# Patient Record
Sex: Male | Born: 1955 | Race: White | Hispanic: No | Marital: Married | Smoking: Never smoker
Health system: Southern US, Community
[De-identification: ages and names within clinical notes are randomized; demographics above are authoritative.]

## PROBLEM LIST (undated history)

## (undated) DIAGNOSIS — Z9889 Other specified postprocedural states: Secondary | ICD-10-CM

## (undated) DIAGNOSIS — G7000 Myasthenia gravis without (acute) exacerbation: Secondary | ICD-10-CM

## (undated) DIAGNOSIS — T4145XA Adverse effect of unspecified anesthetic, initial encounter: Secondary | ICD-10-CM

## (undated) DIAGNOSIS — S060XAA Concussion with loss of consciousness status unknown, initial encounter: Secondary | ICD-10-CM

## (undated) DIAGNOSIS — Z789 Other specified health status: Secondary | ICD-10-CM

## (undated) DIAGNOSIS — R112 Nausea with vomiting, unspecified: Secondary | ICD-10-CM

## (undated) DIAGNOSIS — T8859XA Other complications of anesthesia, initial encounter: Secondary | ICD-10-CM

## (undated) DIAGNOSIS — S060X9A Concussion with loss of consciousness of unspecified duration, initial encounter: Secondary | ICD-10-CM

## (undated) HISTORY — PX: OTHER SURGICAL HISTORY: SHX169

---

## 2008-02-19 ENCOUNTER — Ambulatory Visit: Payer: Self-pay | Admitting: Internal Medicine

## 2008-03-11 ENCOUNTER — Encounter: Payer: Self-pay | Admitting: Internal Medicine

## 2008-03-11 ENCOUNTER — Ambulatory Visit: Payer: Self-pay

## 2008-03-25 ENCOUNTER — Ambulatory Visit: Payer: Self-pay | Admitting: Internal Medicine

## 2008-03-25 LAB — CONVERTED CEMR LAB: Free T4: 0.89 ng/dL (ref 0.89–1.80)

## 2010-11-08 NOTE — Assessment & Plan Note (Signed)
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  He has a history of chest pain and  palpitations with 2 previous normal Myoviews.  He has never had a heart  catheterization.   I saw him last month for the first time for chest pain and palpitations,  this chest pain was fairly atypical.  He is a fairly active guy.   We got an echocardiogram which showed an ejection fraction of 50-55%  with no regional wall motion abnormalities.  We did put an event monitor  on him for 2 weeks, but he did not have any episodes of palpitations  during this time.  He said they resolved after he switched to  decaffeinated coffee.   He says he is doing great, he denies any recurrent chest pain or  exertional dyspnea.  He did get some blood work drawn which showed a  total cholesterol of 239, HDL of 59, LDL of 160, triglycerides of 99.  His C-reactive protein was good at 0.5.  His TSH was mildly elevated at  5.4.  Electrolytes and renal function were normal.   CURRENT MEDICATIONS:  None.   PHYSICAL EXAMINATION:  GENERAL:  He is well appearing and in no acute  distress.  Ambulatory in the clinic without any respiratory difficulty.  VITAL SIGNS:  Blood pressure is 100/70, heart rate is 76, weight is 155.  HEENT:  Normal.  NECK:  Supple.  No JVD.  Carotids are 2+ bilaterally without bruits.  There is no lymphadenopathy or thyromegaly.  CARDIAC:  PMI is normal.  Regular rate and rhythm.  No murmurs, rubs, or  gallops.  LUNGS:  Clear.  ABDOMEN:  Soft, nontender, and nondistended.  No hepatosplenomegaly.  No  bruits.  No masses.  Good bowel sounds.  EXTREMITIES:  Warm with no cyanosis, clubbing, or edema.  Good  pulses.  No rash.  NEURO:  Alert and oriented x3.  Cranial nerves II through XII are  intact.  Moves all 4 extremities without difficulty.  Affect is  pleasant.   ASSESSMENT:  1. Chest pain.  This is somewhat atypical.  I do think it is worth      screening treadmill test to further evaluate.  We will set this up.  2. Hyperlipidemia.  Cholesterol was markedly elevated.  He is      resistant to the notion of statins.  I told him it was unlikely to      be able to control it with diet alone, but he would like at least 3      months to see what he can do.  I referred him to Tanner James books      to see if that will help him.  We will recheck in 3 months.  3. Palpitations.  These have resolved.  We will await for results of      his monitor.  4. Possible hypothyroidism with an elevated thyroid-stimulating      hormone.  We will check a full thyroid panel, he may need a      referral.     Tanner James. Bensimhon, MD  Electronically Signed    DRB/MedQ  DD: 03/25/2008  DT: 03/26/2008  Job #: [id number]

## 2010-11-08 NOTE — Assessment & Plan Note (Signed)
[facility] OFFICE NOTE   XAVIOR, NIAZI                           MRN:          [id number]  DATE:02/19/2008                            DOB:          10/24/1955    REASON FOR EVALUATION:  Chest tightness and palpitations.   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  He denies any known history of coronary artery disease.  He  has not had hypertension, hyperlipidemia, or diabetes.  He does have a  history of chest pain and palpitations, and over the past 5 or 10 years  has had 2 stress tests, one with Dr. Gwen Pounds and the other one with Dr.  Dossie Arbour.  These were nuclear tests, both were normal.  He has never had  a catheterization.  He tells me that he does get occasional chest pain  and feels like his chest just gets tight, it happens every couple of  weeks.  It lasts about 30 minutes to an hour, it can happen at any time.  There are no other related symptoms.  He does walk a lot at work and  also in his spare time.  He denies any exertional discomfort or  shortness of breath.  He also notes that about twice a month, he gets  episodes where his heart rate just seems to speed up.  He is usually  sitting quietly and also he notes that his heart rate is speeding up and  then it slows back down about a minute or two later.  He does not get  chest pain with this.  He does not get short of breath.  There are no  syncope or presyncope.  These have been going on for quite some time.  He does not have any flushing or headaches associated with the symptoms.   REVIEW OF SYSTEMS:  Notable for peptic ulcer disease.  Remainder review  of systems is negative except for HPI and problem list.   PROBLEM LIST:  History of chest pain and palpitations.  He is status  post 2 previous negative Myoviews.  He has never had an echocardiogram.  He has peptic ulcer disease.   MEDICATIONS:   Aspirin one a day.   ALLERGIES:  None.   SOCIAL HISTORY:  He is married with two children.  He works as an Occupational hygienist at [employer].  He never smoked.  Drinks occasional alcoholic  beverage.   FAMILY HISTORY:  Mother is alive at 29.  She has history of coronary  artery disease and has a couple of stents in her 46s, also has a  pacemaker.  Father is alive at 75.  There is no family history of  premature coronary artery disease.   PHYSICAL EXAMINATION:  GENERAL:  He is well-appearing in no acute  distress.  Ambulates around the clinic without any respiratory  difficulty.  VITAL SIGNS:  Blood pressure is 108/78, heart rate is 80, weight is 154.  HEENT:  Normal.  NECK:  Supple.  No JVD.  Carotids are 2+ bilaterally  without bruits.  There is no lymphadenopathy or thyromegaly.  CARDIAC:  PMI is nondisplaced.  Regular rate and rhythm.  No murmurs,  rubs, or gallops.  LUNGS:  Clear.  ABDOMEN:  Soft, nontender, nondistended.  There is no  hepatosplenomegaly.  No bruits.  No masses.  Good bowel sounds.  EXTREMITIES:  Warm.  No cyanosis, clubbing, or edema.  Good pulses.  He  has several tattoos.  NEURO:  Alert and oriented x3.  Cranial nerves II through XII are  intact.  Moves all 4 extremities without difficulty.  Affect is  pleasant.   EKG shows normal sinus rhythm.  No significant ST-T wave abnormalities.  Normal axes  and intervals.  There is no pre-excitation.  QT interval is  normal.   ASSESSMENT/PLAN:  Chest pain and palpitations.  I think these are likely  benign.  They maybe related just to anxiety.  He does seem to be fairly  hypervigilant about his symptoms.  At this point, I think it is  reasonable to workup his palpitations as these have not been worked up  before to make sure he does not have an underlying supraventricular  tachycardia.  Check a 2-D echocardiogram to make sure he has  structurally normal heart.  Check a thyroid panel and also put a  LifeWatch monitor on  him.   Regarding his chest pain, I explained to him that I thought this was  noncardiac.  We will workup his palpitations first.  We did discuss  possibly repeating his stress test versus to simple reassurance and we  even discussed cardiac catheterization.  He like to think on this some  more.   DISPOSITION:  We will see him back in 1 month to review the results of  his testing and see how he is doing.     Bevelyn Buckles. Bensimhon, MD  Electronically Signed    DRB/MedQ  DD: 02/19/2008  DT: 02/20/2008  Job #: [id number]

## 2012-11-06 ENCOUNTER — Ambulatory Visit: Payer: Self-pay | Admitting: Ophthalmology

## 2014-01-07 ENCOUNTER — Ambulatory Visit: Payer: Self-pay | Admitting: Urology

## 2015-11-04 ENCOUNTER — Emergency Department
Admission: EM | Admit: 2015-11-04 | Discharge: 2015-11-04 | Disposition: A | Discharge status: Home / Self Care | Payer: Managed Care, Other (non HMO) | Attending: Emergency Medicine | Admitting: Emergency Medicine

## 2015-11-04 ENCOUNTER — Emergency Department: Payer: Managed Care, Other (non HMO)

## 2015-11-04 ENCOUNTER — Encounter: Payer: Self-pay | Admitting: Emergency Medicine

## 2015-11-04 DIAGNOSIS — M545 Low back pain, unspecified: Secondary | ICD-10-CM

## 2015-11-04 DIAGNOSIS — M5126 Other intervertebral disc displacement, lumbar region: Secondary | ICD-10-CM

## 2015-11-04 MED ORDER — DIAZEPAM 5 MG PO TABS: 5.0000 mg | ORAL_TABLET | Freq: Three times a day (TID) | ORAL | Status: DC | PRN

## 2015-11-04 MED ORDER — DIAZEPAM 5 MG/ML IJ SOLN
5.0000 mg | Freq: Once | INTRAMUSCULAR | Status: AC
  Administered 2015-11-04: 5 mg via INTRAVENOUS
  Filled 2015-11-04: qty 2

## 2015-11-04 MED ORDER — DIAZEPAM 5 MG/ML IJ SOLN
5.0000 mg | Freq: Once | INTRAMUSCULAR | Status: AC
  Administered 2015-11-04: 5 mg via INTRAVENOUS

## 2015-11-04 MED ORDER — ONDANSETRON HCL 4 MG/2ML IJ SOLN
INTRAMUSCULAR | Status: AC
  Administered 2015-11-04: 4 mg via INTRAVENOUS
  Filled 2015-11-04: qty 2

## 2015-11-04 MED ORDER — MORPHINE SULFATE (PF) 4 MG/ML IV SOLN: 4.0000 mg | Freq: Once | INTRAVENOUS | Status: DC

## 2015-11-04 MED ORDER — DIAZEPAM 5 MG/ML IJ SOLN
INTRAMUSCULAR | Status: AC
  Administered 2015-11-04: 5 mg via INTRAVENOUS
  Filled 2015-11-04: qty 2

## 2015-11-04 MED ORDER — METHYLPREDNISOLONE SODIUM SUCC 125 MG IJ SOLR
125.0000 mg | Freq: Once | INTRAMUSCULAR | Status: AC
  Administered 2015-11-04: 125 mg via INTRAVENOUS
  Filled 2015-11-04: qty 2

## 2015-11-04 MED ORDER — PREDNISONE 20 MG PO TABS: 60.0000 mg | ORAL_TABLET | Freq: Every day | ORAL | Status: AC

## 2015-11-04 MED ORDER — MORPHINE SULFATE (PF) 4 MG/ML IV SOLN
INTRAVENOUS | Status: AC
  Filled 2015-11-04: qty 1

## 2015-11-04 MED ORDER — ONDANSETRON HCL 4 MG/2ML IJ SOLN
4.0000 mg | Freq: Once | INTRAMUSCULAR | Status: AC
  Administered 2015-11-04: 4 mg via INTRAVENOUS

## 2015-11-04 NOTE — Discharge Instructions (Signed)
Herniated Disk  A herniated disk occurs when a disk in your spine bulges out too far. This condition is also called a ruptured disk or slipped disk. Your spine (backbone) is made up of bones called vertebrae. Between each pair of vertebrae is an oval disk with a soft, spongy center that acts as a shock absorber when you move. The spongy center is surrounded by a tough outer ring.  When you have a herniated disk, the spongy center of the disk bulges out or ruptures through the outer ring. A herniated disk can press on a nerve between your vertebrae and cause pain. A herniated disk can occur anywhere in your back or neck area, but the lower back is the most common spot.  CAUSES   In many cases, a herniated disk occurs just from getting older. As you age, the spongy insides of your disks tend to shrink and dry out. A herniated disk can result from gradual wear and tear. Injury or sudden strain can also cause a herniated disk.   RISK FACTORS  Aging is the main risk factor for a herniated disk. Other risk factors include:   Being a man between the ages of 30 and 50 years.   Having a job that requires heavy lifting, bending, or twisting.   Having a job that requires long hours of driving.   Not getting enough exercise.   Being overweight.   Smoking.  SIGNS AND SYMPTOMS   Signs and symptoms depend on which disk is herniated.   For a herniated disk in the lower back, you may have sharp pain in:    One part of your leg, hip, or buttocks.    The back of your calf.    The top or sole of your foot (sciatica).    For a herniated disk in the neck, you may feel pain:    When you move your neck.    Near or over your shoulder blade.    That moves to your upper arm, forearm, or fingers.    You may also have muscle weakness. It may be hard to:    Lift your leg or arm.    Stand on your toes.    Squeeze tightly with one of your hands.   Other symptoms can include:    Numbness or tingling in the affected areas of your body.     Loss of bladder or bowel control. This is a rare but serious sign of a severe herniated disk in the lower back.  DIAGNOSIS   Your health care provider will do a physical exam. During this exam, you may have to move certain body parts or assume various positions. For example, your health care provider may do the straight-leg test. This is a good way to test for a herniated disk in your lower back. In this test, the health care provider lifts your leg while you lie on your back. This is to see if you feel pain down your leg. Your health care provider will also check for numbness or loss of feeling.   Your health care provider will also check your:   Reflexes.   Muscle strength.   Posture.   Other tests may be done to help in making a diagnosis. These may include:   An X-ray of the spine to rule out other causes of back pain.    Other imaging studies, such as an MRI or CT scan. This is to check whether the herniated disk is   pressing on your spinal canal.   Electromyography (EMG). This test checks the nerves that control muscles. It is sometimes used to identify the specific area of nerve involvement.   TREATMENT   In many cases, herniated disk symptoms go away over a period of days or weeks. You will most likely be free of symptoms in 3-4 months. Treatment may include the following:   The initial treatment for a herniated disk is ashort period of rest.    Bed rest is often limited to 1 or 2 days. Resting for too long delays recovery.    If you have a herniated disk in your lower back, you should avoid sitting as much as possible because sitting increases pressure on the disk.   Medicines. These may include:     Nonsteroidal anti-inflammatory drugs (NSAIDs).    Muscle relaxants for back spasms.    Narcotic pain medicine if your pain is very bad.    Steroid injections. You may need these along the involved nerve root to help control pain. The steroid is injected in the area of the herniated disk. It  helps by reducing swelling around the disk.   Physical therapy. This may include exercises to strengthen the muscles that help support your spine.    You may need surgery if other treatments do not work.   HOME CARE INSTRUCTIONS  Follow all your health care provider's instructions. These may include:   Take all medicines as directed by your health care provider.   Rest for 2 days and then start moving.   Do not sit or stand for long periods of time.   Maintain good posture when sitting and standing.   Avoid movements that cause pain, such as bending or lifting.   When you are able to start lifting things again:   Bend with your knees.   Keep your back straight.   Hold heavy objects close to your body.   If you are overweight, ask your health care provider to help you start a weight-loss program.   When you are able to start exercising, ask your health care provider how much and what type of exercise is best for you.   Work with a physical therapist on stretching and strengthening exercises for your back.   Do not wear high-heeled shoes.   Do not sleep on your belly.   Do not smoke.   Keep all follow-up visits as directed by your health care provider.  SEEK MEDICAL CARE IF:   You have back or neck pain that is not getting better after 4 weeks.   You have very bad pain in your back or neck.   You develop numbness, tingling, or weakness along with pain.  SEEK IMMEDIATE MEDICAL CARE IF:    You have numbness, tingling, or weakness that makes you unable to use your arms or legs.   You lose control of your bladder or bowels.   You have dizziness or fainting.   You have shortness of breath.   MAKE SURE YOU:    Understand these instructions.   Will watch your condition.   Will get help right away if you are not doing well or get worse.     This information is not intended to replace advice given to you by your health care provider. Make sure you discuss any questions you have with your health  care provider.     Document Released: 06/09/2000 Document Revised: 07/03/2014 Document Reviewed: 05/16/2013  Elsevier Interactive Patient Education   2016 Elsevier Inc[employer]

## 2015-11-04 NOTE — ED Provider Notes (Signed)
[facility] Emergency Department Provider Note  ____________________________________________  Time seen: 3:15 AM  I have reviewed the triage vital signs and the nursing notes.   HISTORY  Chief Complaint Back Pain     HPI Tanner ReekBrian James is a 60 y.o. male with history of previous "back problems which she was evaluated [place] many years ago presents to the emergency department with complaint of low back pain that has been intermittent over the past week but with acute worsening tonight. Patient states as result of his back pain over the past week he slept in his lazy boy tonight. Patient states he awoke and attempted to get into his bed but was unable to do so secondary to a acute worsening of his pain. Patient states that his pain is currently 4 out of 10 but that he gets intense spasms in his lower back with the pain worsening to 10 out of 10. Patient states that he has at least 1-2 episodes similar to this every year but has not been evaluated by physician since when he did so many years ago in [place].     Past medical history "Back problems". There are no active problems to display for this patient.   Past surgical history None No current outpatient prescriptions on file.  Allergies No known drug allergies History reviewed. No pertinent family history.  Social History Social History  Substance Use Topics  . Smoking status: Never Smoker   . Smokeless tobacco: None  . Alcohol Use: No    Review of Systems  Constitutional: Negative for fever. Eyes: Negative for visual changes. ENT: Negative for sore throat. Cardiovascular: Negative for chest pain. Respiratory: Negative for shortness of breath. Gastrointestinal: Negative for abdominal pain, vomiting and diarrhea. Genitourinary: Negative for dysuria. Musculoskeletal: Positive for back pain. Skin: Negative for rash. Neurological: Negative for headaches, focal weakness or numbness.     10-point ROS otherwise negative.  ____________________________________________   PHYSICAL EXAM:  VITAL SIGNS: ED Triage Vitals  Enc Vitals Group     BP 11/04/15 0247 152/90 mmHg     Pulse Rate 11/04/15 0247 94     Resp 11/04/15 0247 20     Temp 11/04/15 0247 98.6 F (37 C)     Temp Source 11/04/15 0247 Oral     SpO2 11/04/15 0247 100 %     Weight 11/04/15 0247 150 lb (68.04 kg)     Height 11/04/15 0247 5\' 8"  (1.727 m)     Head Cir --      Peak Flow --      Pain Score 11/04/15 0248 4     Pain Loc --      Pain Edu? --      Excl. in GC? --      Constitutional: Alert and oriented.Apparent discomfort Eyes: Conjunctivae are normal. PERRL. Normal extraocular movements. ENT   Head: Normocephalic and atraumatic.   Nose: No congestion/rhinnorhea.   Mouth/Throat: Mucous membranes are moist.   Neck: No stridor. Hematological/Lymphatic/Immunilogical: No cervical lymphadenopathy. Cardiovascular: Normal rate, regular rhythm. Normal and symmetric distal pulses are present in all extremities. No murmurs, rubs, or gallops. Respiratory: Normal respiratory effort without tachypnea nor retractions. Breath sounds are clear and equal bilaterally. No wheezes/rales/rhonchi. Gastrointestinal: Soft and nontender. No distention. There is no CVA tenderness. Genitourinary: deferred Musculoskeletal: Nontender with normal range of motion in all extremities. No joint effusions.  No lower extremity tenderness nor edema. Bilateral paraspinal muscle pain with palpation Neurologic:  Normal speech and  language. No gross focal neurologic deficits are appreciated. Speech is normal.  Skin:  Skin is warm, dry and intact. No rash noted. Psychiatric: Mood and affect are normal. Speech and behavior are normal. Patient exhibits appropriate insight and judgment.    RADIOLOGY     MR Lumbar Spine Wo Contrast (Final result) Result time: 11/04/15 05:34:46   Final result by Rad Results In Interface  (11/04/15 05:34:46)   Narrative:   CLINICAL DATA: Severe low back pain for 1 week, worsening.  EXAM: MRI LUMBAR SPINE WITHOUT CONTRAST  TECHNIQUE: Multiplanar, multisequence MR imaging of the lumbar spine was performed. No intravenous contrast was administered.  COMPARISON: CT abdomen and pelvis January 07, 2014  FINDINGS: The lumbar vertebral bodies and posterior elements are intact and aligned with maintenance of lumbar lordosis. Using the reference level of the last well-formed intervertebral disc as L5-S1, moderate L3-4 and L4-5 disc height loss with decreased T2 signal within the disc compatible with desiccation. Mild chronic discogenic endplate changes L3-4 and L4-5 without STIR signal abnormality to suggest acute osseous process.  Conus medullaris terminates at L1 and appears normal morphology and signal characteristics. Cauda equina is normal. Included prevertebral and paraspinal soft tissues are normal.  Level by level evaluation:  T12-L1, L1-2, L2-3: No disc bulge, canal stenosis nor neural foraminal narrowing.  L3-4: Small broad-based disc bulge with superimposed 2 mm LEFT central disc protrusion with small amount of presumed subligamentous hemorrhage posteriorly displaces traversing LEFT L4 nerve within the lateral recess. No canal stenosis. Mild LEFT neural foraminal narrowing.  L4-5: Moderate 4 mm broad-based disc bulge. Mild facet arthropathy and ligamentum flavum redundancy without canal stenosis though there is encroachment upon the traversing L5 nurse in the lateral recesses. Mild bilateral neural foraminal narrowing.  L5-S1: 2 mm central disc protrusion. Mild facet arthropathy and ligamentum flavum redundancy without canal stenosis or neural foraminal narrowing.  IMPRESSION: Small LEFT central L3-4 disc protrusion is likely acute, and displaces the traversing LEFT L4 nerve.  No acute fracture or malalignment.  No canal stenosis. Mild L3-4 and  L4-5 neural foraminal narrowing.   Electronically Signed By: Awilda Metro M.D. On: 11/04/2015 05:34         INITIAL IMPRESSION / ASSESSMENT AND PLAN / ED COURSE  Pertinent labs & imaging results that were available during my care of the patient were reviewed by me and considered in my medical decision making (see chart for details).  Patient received 5 mg IV Valium in the emergency department with improvement of back spasms. MRI revealed L3-4 herniated disc. Patient will be prescribed Valium for home and will be referred to Dr. Margaretann Loveless for further outpatient evaluation  ____________________________________________   FINAL CLINICAL IMPRESSION(S) / ED DIAGNOSES  Final diagnoses:  Lumbar back pain  Lumbar herniated disc      Darci Current, MD 11/04/15 [phone]

## 2015-11-04 NOTE — ED Notes (Signed)
Pt arrived via EMS from home c/o lower back pain. Reports that back pain started about a week ago, denies any known injury, came into tonight because back pain got worse, with spasms, unable to stand up. Denies any urinary symptoms. Alert and oriented, wife at bedside with patient.

## 2016-08-14 ENCOUNTER — Ambulatory Visit
Admission: EM | Admit: 2016-08-14 | Discharge: 2016-08-14 | Disposition: A | Discharge status: Home / Self Care | Payer: Managed Care, Other (non HMO) | Attending: Family Medicine | Admitting: Family Medicine

## 2016-08-14 ENCOUNTER — Ambulatory Visit (INDEPENDENT_AMBULATORY_CARE_PROVIDER_SITE_OTHER): Payer: Managed Care, Other (non HMO)

## 2016-08-14 DIAGNOSIS — M722 Plantar fascial fibromatosis: Secondary | ICD-10-CM

## 2016-08-14 MED ORDER — NAPROXEN 500 MG PO TABS: 500.0000 mg | ORAL_TABLET | Freq: Two times a day (BID) | ORAL | 0 refills | Status: DC

## 2016-08-14 NOTE — ED Provider Notes (Signed)
CSN: [id number]     Arrival date & time 08/14/16  [id number] History   First MD Initiated Contact with Patient 08/14/16 1012     Chief Complaint  Patient presents with  . Foot Pain    Left Foot   (Consider location/radiation/quality/duration/timing/severity/associated sxs/prior Treatment) HPI  This a [age] year old male who states that 2 days ago he started having left foot pain when he indicates the lateral and plantar area.  Does not Remember any trauma to the area not had any increase in activity other than walking longer distance to lunch on 2 days prior to the onset.Now Painful to ambulate.      History reviewed. No pertinent past medical history. History reviewed. No pertinent surgical history. History reviewed. No pertinent family history. Social History  Substance Use Topics  . Smoking status: Never Smoker  . Smokeless tobacco: Never Used  . Alcohol use Yes    Review of Systems  Allergies  Patient has no known allergies.  Home Medications   Prior to Admission medications   Medication Sig Start Date End Date Taking? Authorizing Provider  naproxen (NAPROSYN) 500 MG tablet Take 1 tablet (500 mg total) by mouth 2 (two) times daily with a meal. 08/14/16   Lutricia FeilWilliam P Roemer, PA-C   Meds Ordered and Administered this Visit  Medications - No data to display  BP 123/72 (BP Location: Left Arm)   Pulse 74   Temp 98.1 F (36.7 C) (Oral)   Resp 18   Ht 5\' 8"  (1.727 m)   Wt 150 lb (68 kg)   SpO2 100%   BMI 22.81 kg/m  No data found.   Physical Exam  Urgent Care Course     Procedures (including critical care time)  Labs Review Labs Reviewed - No data to display  Imaging Review Dg Foot Complete Left  Result Date: 08/14/2016 CLINICAL DATA:  Severe pain. EXAM: LEFT FOOT - COMPLETE 3+ VIEW COMPARISON:  No recent prior P FINDINGS: No acute bony or joint abnormality identified. No evidence of fracture or dislocation. IMPRESSION: No acute abnormality. Electronically Signed    By: Maisie Fushomas  Register   On: 08/14/2016 09:51     Visual Acuity Review  Right Eye Distance:   Left Eye Distance:   Bilateral Distance:    Right Eye Near:   Left Eye Near:    Bilateral Near:         MDM   1. Plantar fasciitis of left foot    Discharge Medication List as of 08/14/2016 10:29 AM    START taking these medications   Details  naproxen (NAPROSYN) 500 MG tablet Take 1 tablet (500 mg total) by mouth 2 (two) times daily with a meal., Starting Mon 08/14/2016, Normal      Plan: 1. Test/x-ray results and diagnosis reviewed with patient 2. rx as per orders; risks, benefits, potential side effects reviewed with patient 3. Recommend supportive treatment with Limiting walking to avoid symptoms as much as possible. Use Silastic heel inserts in shoes. Take Naprosyn twice daily with food. He was given instructions with exercises for plantar fasciitis. If he is not improving in a week or 2 he should follow-up with podiatrist. Address and phone number and phone number given to the patient. 4. F/u prn if symptoms worsen or don't improve     Lutricia FeilWilliam P Roemer, PA-C 08/14/16 1034

## 2016-08-14 NOTE — ED Triage Notes (Signed)
Pt states that about 2 days ago he started to have left foot pain. He doesnt remember any trauma to it other than 30 years ago he shut it in a car door.

## 2016-12-20 ENCOUNTER — Encounter: Payer: Self-pay | Admitting: *Deleted

## 2016-12-28 ENCOUNTER — Ambulatory Visit
Admission: RE | Admit: 2016-12-28 | Discharge: 2016-12-28 | Disposition: A | Discharge status: Home / Self Care | Payer: Managed Care, Other (non HMO) | Source: Ambulatory Visit | Attending: Ophthalmology | Admitting: Ophthalmology

## 2016-12-28 ENCOUNTER — Encounter
Admission: RE | Disposition: A | Discharge status: Home / Self Care | Payer: Self-pay | Source: Ambulatory Visit | Attending: Ophthalmology

## 2016-12-28 ENCOUNTER — Ambulatory Visit: Payer: Managed Care, Other (non HMO) | Admitting: Anesthesiology

## 2016-12-28 ENCOUNTER — Encounter: Payer: Self-pay | Admitting: Anesthesiology

## 2016-12-28 DIAGNOSIS — H2511 Age-related nuclear cataract, right eye: Secondary | ICD-10-CM | POA: Insufficient documentation

## 2016-12-28 HISTORY — DX: Concussion with loss of consciousness of unspecified duration, initial encounter: S06.0X9A

## 2016-12-28 HISTORY — PX: CATARACT EXTRACTION W/PHACO: SHX586

## 2016-12-28 HISTORY — DX: Nausea with vomiting, unspecified: R11.2

## 2016-12-28 HISTORY — DX: Other complications of anesthesia, initial encounter: T88.59XA

## 2016-12-28 HISTORY — DX: Other specified postprocedural states: Z98.890

## 2016-12-28 HISTORY — DX: Concussion with loss of consciousness status unknown, initial encounter: S06.0XAA

## 2016-12-28 HISTORY — DX: Adverse effect of unspecified anesthetic, initial encounter: T41.45XA

## 2016-12-28 HISTORY — DX: Other specified health status: Z78.9

## 2016-12-28 SURGERY — PHACOEMULSIFICATION, CATARACT, WITH IOL INSERTION
Anesthesia: Monitor Anesthesia Care | Site: Eye | Laterality: Right | Wound class: Clean

## 2016-12-28 MED ORDER — NEOMYCIN-POLYMYXIN-DEXAMETH 0.1 % OP OINT
TOPICAL_OINTMENT | OPHTHALMIC | Status: DC | PRN
  Administered 2016-12-28: 1 via OPHTHALMIC

## 2016-12-28 MED ORDER — ARMC OPHTHALMIC DILATING DROPS
1.0000 "application " | OPHTHALMIC | Status: DC
  Administered 2016-12-28 (×3): 1 via OPHTHALMIC

## 2016-12-28 MED ORDER — NEOMYCIN-POLYMYXIN-DEXAMETH 3.5-10000-0.1 OP OINT
TOPICAL_OINTMENT | OPHTHALMIC | Status: AC
  Filled 2016-12-28: qty 3.5

## 2016-12-28 MED ORDER — MIDAZOLAM HCL 2 MG/2ML IJ SOLN
INTRAMUSCULAR | Status: AC
  Filled 2016-12-28: qty 2

## 2016-12-28 MED ORDER — NA HYALUR & NA CHOND-NA HYALUR 0.55-0.5 ML IO KIT
PACK | INTRAOCULAR | Status: AC
  Filled 2016-12-28: qty 1.05

## 2016-12-28 MED ORDER — EPINEPHRINE PF 1 MG/ML IJ SOLN
INTRAOCULAR | Status: DC | PRN
  Administered 2016-12-28: 09:00:00 via OPHTHALMIC

## 2016-12-28 MED ORDER — EPINEPHRINE PF 1 MG/ML IJ SOLN
INTRAMUSCULAR | Status: AC
  Filled 2016-12-28: qty 2

## 2016-12-28 MED ORDER — POVIDONE-IODINE 5 % OP SOLN
OPHTHALMIC | Status: AC
  Filled 2016-12-28: qty 30

## 2016-12-28 MED ORDER — LIDOCAINE HCL (PF) 4 % IJ SOLN
INTRAMUSCULAR | Status: AC
  Filled 2016-12-28: qty 5

## 2016-12-28 MED ORDER — MOXIFLOXACIN HCL 0.5 % OP SOLN
1.0000 [drp] | OPHTHALMIC | Status: DC
  Administered 2016-12-28 (×3): 1 [drp] via OPHTHALMIC

## 2016-12-28 MED ORDER — MOXIFLOXACIN HCL 0.5 % OP SOLN
OPHTHALMIC | Status: AC
  Filled 2016-12-28: qty 3

## 2016-12-28 MED ORDER — NA HYALUR & NA CHOND-NA HYALUR 0.4-0.35 ML IO KIT
PACK | INTRAOCULAR | Status: DC | PRN
  Administered 2016-12-28: .35 mL via INTRAOCULAR

## 2016-12-28 MED ORDER — MIDAZOLAM HCL 2 MG/2ML IJ SOLN
INTRAMUSCULAR | Status: DC | PRN
  Administered 2016-12-28 (×2): 1 mg via INTRAVENOUS

## 2016-12-28 MED ORDER — POVIDONE-IODINE 5 % OP SOLN
OPHTHALMIC | Status: DC | PRN
  Administered 2016-12-28: 1 via OPHTHALMIC

## 2016-12-28 MED ORDER — ARMC OPHTHALMIC DILATING DROPS
OPHTHALMIC | Status: AC
  Filled 2016-12-28: qty 0.4

## 2016-12-28 MED ORDER — SODIUM CHLORIDE 0.9 % IV SOLN
INTRAVENOUS | Status: DC
  Administered 2016-12-28: 08:00:00 via INTRAVENOUS

## 2016-12-28 MED ORDER — LIDOCAINE HCL (PF) 4 % IJ SOLN
INTRAMUSCULAR | Status: DC | PRN
  Administered 2016-12-28: 4 mL via OPHTHALMIC

## 2016-12-28 MED ORDER — CARBACHOL 0.01 % IO SOLN
INTRAOCULAR | Status: DC | PRN
  Administered 2016-12-28: 0.5 mL via INTRAOCULAR

## 2016-12-28 SURGICAL SUPPLY — 15 items
GLOVE BIO SURGEON STRL SZ8 (GLOVE) ×2 IMPLANT
GLOVE BIOGEL M 6.5 STRL (GLOVE) ×2 IMPLANT
GLOVE SURG LX 7.5 STRW (GLOVE) ×1
GLOVE SURG LX STRL 7.5 STRW (GLOVE) ×1 IMPLANT
GOWN STRL REUS W/ TWL LRG LVL3 (GOWN DISPOSABLE) ×2 IMPLANT
GOWN STRL REUS W/TWL LRG LVL3 (GOWN DISPOSABLE) ×2
LENS IOL TECNIS ITEC 21.0 (Intraocular Lens) ×2 IMPLANT
PACK CATARACT (MISCELLANEOUS) ×2 IMPLANT
PACK CATARACT BRASINGTON LX (MISCELLANEOUS) ×2 IMPLANT
PACK EYE AFTER SURG (MISCELLANEOUS) ×2 IMPLANT
SOL BSS BAG (MISCELLANEOUS) ×2
SOLUTION BSS BAG (MISCELLANEOUS) ×1 IMPLANT
SYR 5ML LL (SYRINGE) ×2 IMPLANT
WATER STERILE IRR 250ML POUR (IV SOLUTION) ×2 IMPLANT
WIPE NON LINTING 3.25X3.25 (MISCELLANEOUS) ×2 IMPLANT

## 2016-12-28 NOTE — Transfer of Care (Signed)
Immediate Anesthesia Transfer of Care Note  Patient: Tanner James  Procedure(s) Performed: Procedure(s) with comments: CATARACT EXTRACTION PHACO AND INTRAOCULAR LENS PLACEMENT (IOC) (Right) - [place] 00:29.6 AP% 17.5 CDE 5.19 Fluid Pack lot # [id number]  Patient Location: PACU  Anesthesia Type:MAC  Level of Consciousness: awake, alert , oriented and patient cooperative  Airway & Oxygen Therapy: Patient Spontanous Breathing  Post-op Assessment: Report given to RN, Post -op Vital signs reviewed and stable and Patient moving all extremities X 4  Post vital signs: Reviewed and stable  Last Vitals:  Vitals:   12/28/16 0755 12/28/16 0922  BP: 123/81 114/64  Pulse: 67 71  Resp: 18 16  Temp: 36.7 C 36.8 C    Last Pain:  Vitals:   12/28/16 0755  TempSrc: Oral         Complications: No apparent anesthesia complications

## 2016-12-28 NOTE — Op Note (Signed)
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 Item Serial No. Manufacturer Lot No. LRB No. Used  LENS IOL DIOP 21.0 - [id number] Intraocular Lens LENS IOL DIOP 21.0 [id number]   Right 1       ULTRASOUND TIME: 18 %  of 0 minutes 30 seconds, CDE 5.2  SURGEON:  Deirdre Evenerhadwick R. Acsa Estey, MD   ANESTHESIA:  Topical with tetracaine drops and 2% Xylocaine jelly, augmented with 1% preservative-free intracameral lidocaine.    COMPLICATIONS:  None.   DESCRIPTION OF PROCEDURE:  The patient was identified in the holding room and transported to the operating room and placed in the supine position under the operating microscope. Theright eye was identified as the operative eye and it was prepped and draped in the usual sterile ophthalmic fashion.   A 1 millimeter clear-corneal paracentesis was made at the 12:00 position.  0.5 ml of preservative-free 1% lidocaine was injected into the anterior chamber. The anterior chamber was filled with Viscoat viscoelastic.  A 2.4 millimeter keratome was used to make a near-clear corneal incision at the 9:00 position. A curvilinear capsulorrhexis was made with a cystotome and capsulorrhexis forceps.  Balanced salt solution was used to hydrodissect and hydrodelineate the nucleus.   Phacoemulsification was then used in stop and chop fashion to remove the lens nucleus and epinucleus.  The remaining cortex was then removed using the irrigation and aspiration handpiece. Provisc was then placed into the capsular bag to distend it for lens placement.  A lens was then injected into the capsular bag.  The remaining viscoelastic was aspirated.  Wounds were hydrated with balanced salt  solution.  The anterior chamber was inflated to a physiologic pressure with balanced salt solution. Vigamox 0.2 ml of a 1mg  per ml solution was injected into the anterior chamber for a dose of 0.2 mg of intracameral antibiotic at the completion of the case. Miostat was placed into the anterior chamber to constrict the pupil.  No wound leaks were noted.  Topical Vigamox drops and Maxitrol ointment were applied to the eye.  The patient was taken to the recovery room in stable condition without complications of anesthesia or surgery.  Misao Fackrell 12/28/2016, 9:19 AM

## 2016-12-28 NOTE — H&P (Signed)
The History and Physical notes are on paper, have been signed, and are to be scanned. The patient remains stable and unchanged from the H&P.   Previous H&P reviewed, patient examined, and there are no changes.  Tanner James 12/28/2016 8:03 AM

## 2016-12-28 NOTE — Anesthesia Post-op Follow-up Note (Cosign Needed)
Anesthesia QCDR form completed.        

## 2016-12-28 NOTE — Anesthesia Postprocedure Evaluation (Signed)
Anesthesia Post Note  Patient: Tanner James Surges  Procedure(s) Performed: Procedure(s) (LRB): CATARACT EXTRACTION PHACO AND INTRAOCULAR LENS PLACEMENT (IOC) (Right)  Patient location during evaluation: PACU Anesthesia Type: MAC Level of consciousness: awake and alert Pain management: pain level controlled Vital Signs Assessment: post-procedure vital signs reviewed and stable Respiratory status: spontaneous breathing, nonlabored ventilation and respiratory function stable Cardiovascular status: stable and blood pressure returned to baseline Anesthetic complications: no     Last Vitals:  Vitals:   12/28/16 0755 12/28/16 0922  BP: 123/81 114/64  Pulse: 67 71  Resp: 18 16  Temp: 36.7 C 36.8 C    Last Pain:  Vitals:   12/28/16 0755  TempSrc: Oral                 Silvana Newness A

## 2016-12-28 NOTE — Discharge Instructions (Signed)
Eye Surgery Discharge Instructions  Expect mild scratchy sensation or mild soreness. DO NOT RUB YOUR EYE!  The day of surgery:  Minimal physical activity, but bed rest is not required  No reading, computer work, or close hand work  No bending, lifting, or straining.  May watch TV  For 24 hours:  No driving, legal decisions, or alcoholic beverages  Safety precautions  Eat anything you prefer: It is better to start with liquids, then soup then solid foods.  _____ Eye patch should be worn until postoperative exam tomorrow.  ____ Solar shield eyeglasses should be worn for comfort in the sunlight/patch while sleeping  Resume all regular medications including aspirin or Coumadin if these were discontinued prior to surgery. You may shower, bathe, shave, or wash your hair. Tylenol may be taken for mild discomfort.  Call your doctor if you experience significant pain, nausea, or vomiting, fever > 101 or other signs of infection. [phone] or [phone] Specific instructions:  Follow-up Information    Lockie MolaBrasington, Chadwick, MD. Go on 12/29/2016.   Specialty:  Ophthalmology Why:  Appointment time is set for 10:35 am TOMORROW.  Contact information: [address]   [place] [id number] [phone]

## 2016-12-28 NOTE — Anesthesia Preprocedure Evaluation (Signed)
Anesthesia Evaluation  Patient identified by MRN, date of birth, ID band Patient awake    Reviewed: Allergy & Precautions, NPO status , Patient's Chart, lab work & pertinent test results, reviewed documented beta blocker date and time   History of Anesthesia Complications (+) PONV and history of anesthetic complications  Airway Mallampati: II  TM Distance: >3 FB     Dental  (+) Chipped   Pulmonary           Cardiovascular      Neuro/Psych    GI/Hepatic   Endo/Other    Renal/GU      Musculoskeletal   Abdominal   Peds  Hematology   Anesthesia Other Findings   Reproductive/Obstetrics                             Anesthesia Physical Anesthesia Plan  ASA: III  Anesthesia Plan: General   Post-op Pain Management:    Induction: Intravenous  PONV Risk Score and Plan:   Airway Management Planned: Oral ETT  Additional Equipment:   Intra-op Plan:   Post-operative Plan:   Informed Consent: I have reviewed the patients History and Physical, chart, labs and discussed the procedure including the risks, benefits and alternatives for the proposed anesthesia with the patient or authorized representative who has indicated his/her understanding and acceptance.     Plan Discussed with: CRNA  Anesthesia Plan Comments:         Anesthesia Quick Evaluation

## 2016-12-29 ENCOUNTER — Encounter: Payer: Self-pay | Admitting: Ophthalmology

## 2017-09-12 ENCOUNTER — Emergency Department: Payer: Managed Care, Other (non HMO)

## 2017-09-12 ENCOUNTER — Encounter: Payer: Self-pay | Admitting: Emergency Medicine

## 2017-09-12 ENCOUNTER — Emergency Department
Admission: EM | Admit: 2017-09-12 | Discharge: 2017-09-12 | Disposition: A | Discharge status: Home / Self Care | Payer: Managed Care, Other (non HMO) | Attending: Emergency Medicine | Admitting: Emergency Medicine

## 2017-09-12 DIAGNOSIS — R1310 Dysphagia, unspecified: Secondary | ICD-10-CM

## 2017-09-12 DIAGNOSIS — R479 Unspecified speech disturbances: Secondary | ICD-10-CM | POA: Diagnosis not present

## 2017-09-12 LAB — CBC WITH DIFFERENTIAL/PLATELET
BASOS ABS: 0.1 10*3/uL (ref 0–0.1)
Basophils Relative: 1 %
EOS ABS: 0.2 10*3/uL (ref 0–0.7)
EOS PCT: 2 %
HCT: 42.3 % (ref 40.0–52.0)
Hemoglobin: 14.2 g/dL (ref 13.0–18.0)
Lymphocytes Relative: 14 %
Lymphs Abs: 1.2 10*3/uL (ref 1.0–3.6)
MCH: 31.3 pg (ref 26.0–34.0)
MCHC: 33.5 g/dL (ref 32.0–36.0)
MCV: 93.6 fL (ref 80.0–100.0)
Monocytes Absolute: 0.9 10*3/uL (ref 0.2–1.0)
Monocytes Relative: 10 %
Neutro Abs: 6.2 10*3/uL (ref 1.4–6.5)
Neutrophils Relative %: 73 %
PLATELETS: 288 10*3/uL (ref 150–440)
RBC: 4.52 MIL/uL (ref 4.40–5.90)
RDW: 16.8 % — ABNORMAL HIGH (ref 11.5–14.5)
WBC: 8.5 10*3/uL (ref 3.8–10.6)

## 2017-09-12 LAB — SEDIMENTATION RATE: SED RATE: 4 mm/h (ref 0–20)

## 2017-09-12 LAB — COMPREHENSIVE METABOLIC PANEL
ALT: 15 U/L — ABNORMAL LOW (ref 17–63)
AST: 23 U/L (ref 15–41)
Albumin: 4.4 g/dL (ref 3.5–5.0)
Alkaline Phosphatase: 62 U/L (ref 38–126)
Anion gap: 8 (ref 5–15)
BILIRUBIN TOTAL: 0.9 mg/dL (ref 0.3–1.2)
BUN: 15 mg/dL (ref 6–20)
CHLORIDE: 106 mmol/L (ref 101–111)
CO2: 26 mmol/L (ref 22–32)
Calcium: 8.9 mg/dL (ref 8.9–10.3)
Creatinine, Ser: 1.08 mg/dL (ref 0.61–1.24)
Glucose, Bld: 86 mg/dL (ref 65–99)
POTASSIUM: 4.1 mmol/L (ref 3.5–5.1)
Sodium: 140 mmol/L (ref 135–145)
Total Protein: 7.5 g/dL (ref 6.5–8.1)

## 2017-09-12 MED ORDER — IOPAMIDOL (ISOVUE-300) INJECTION 61%
75.0000 mL | Freq: Once | INTRAVENOUS | Status: AC | PRN
  Administered 2017-09-12: 75 mL via INTRAVENOUS

## 2017-09-12 NOTE — ED Notes (Signed)
Patient transported to CT 

## 2017-09-12 NOTE — ED Notes (Signed)
Pt back from MRI at this time

## 2017-09-12 NOTE — ED Notes (Signed)
First Nurse Note:  [facility], alert and oriented.  Apologized for wait. Patient told that he is next for bed placement in the ED.  Denies any new or worsening symptoms.

## 2017-09-12 NOTE — ED Notes (Signed)
First Nurse Note:  Patient to CT via WC.

## 2017-09-12 NOTE — ED Notes (Signed)
First Nurse Note:  [facility] X weeks and slurring of speech X 2 days.  Alert and oriented, ambulatory.  NAD.

## 2017-09-12 NOTE — ED Notes (Signed)
First Nurse Note:  Patient to room 7 via WC.  Morrie SheldonAshley RN aware of placement in room.

## 2017-09-12 NOTE — ED Triage Notes (Signed)
Pt sent  [facility] for further eval of difficulty swallowing for a couple of weeks and slurred speech for two days.  Denies any headache or dizziness.

## 2017-09-12 NOTE — ED Notes (Signed)
Pt verbalizes understanding of d/c instructions and follow up. 

## 2017-09-12 NOTE — ED Provider Notes (Signed)
[facility] Emergency Department Provider Note       Time seen: ----------------------------------------- 10:04 AM on 09/12/2017 -----------------------------------------   I have reviewed the triage vital signs and the nursing notes.  HISTORY   Chief Complaint Dysphagia    HPI Tanner James is a 62 y.o. male with no significant past medical history who presents to the ED for difficulty swallowing for the past couple weeks.  Patient had slurred speech for 2 days.  Patient states he has difficulty saying both S and J.  He also has difficulty blowing his nose.  He has never had these problems before.  He states his food does not get stuck but he has trouble getting the food down.  He denies numbness, tingling, weakness or headache  Past Medical History:  Diagnosis Date  . Complication of anesthesia   . Concussion    8/17  . Medical history non-contributory   . PONV (postoperative nausea and vomiting)    AFTER COLONOSCOPY    There are no active problems to display for this patient.   Past Surgical History:  Procedure Laterality Date  . CATARACT EXTRACTION W/PHACO Right 12/28/2016   Procedure: CATARACT EXTRACTION PHACO AND INTRAOCULAR LENS PLACEMENT (IOC);  Surgeon: Lockie MolaBrasington, Chadwick, MD;  Location: [facility] ORS;  Service: Ophthalmology;  Laterality: Right;  US 00:29.6 AP% 17.5 CDE 5.19 Fluid Pack lot # [id number]  . COLONSCOPY      Allergies Patient has no known allergies.  Social History Social History   Tobacco Use  . Smoking status: Never Smoker  . Smokeless tobacco: Never Used  Substance Use Topics  . Alcohol use: Yes  . Drug use: No    Review of Systems Constitutional: Negative for fever. Eyes: Negative for vision changes ENT: Positive for dysphagia Cardiovascular: Negative for chest pain. Respiratory: Negative for shortness of breath. Gastrointestinal: Negative for abdominal pain, vomiting and diarrhea. Musculoskeletal: Negative for  back pain. Skin: Negative for rash. Neurological: Negative for headaches, focal weakness or numbness.  All systems negative/normal/unremarkable except as stated in the HPI  ____________________________________________   PHYSICAL EXAM:  VITAL SIGNS: ED Triage Vitals [09/12/17 0829]  Enc Vitals Group     BP (!) 137/111     Pulse Rate 81     Resp 20     Temp 97.9 F (36.6 C)     Temp Source Oral     SpO2 100 %     Weight 150 lb (68 kg)     Height      Head Circumference      Peak Flow      Pain Score      Pain Loc      Pain Edu?      Excl. in GC?    Constitutional: Alert and oriented. Well appearing and in no distress. Eyes: Conjunctivae are normal. Normal extraocular movements. ENT   Head: Normocephalic and atraumatic.   Nose: No congestion/rhinnorhea.   Mouth/Throat: Mucous membranes are moist.   Neck: No stridor. Cardiovascular: Normal rate, regular rhythm. No murmurs, rubs, or gallops. Respiratory: Normal respiratory effort without tachypnea nor retractions. Breath sounds are clear and equal bilaterally. No wheezes/rales/rhonchi. Gastrointestinal: Soft and nontender. Normal bowel sounds Musculoskeletal: Nontender with normal range of motion in extremities. No lower extremity tenderness nor edema. Neurologic:  Normal speech and language. No gross focal neurologic deficits are appreciated.  Skin:  Skin is warm, dry and intact. No rash noted. Psychiatric: Mood and affect are normal. Speech and behavior are normal.  ____________________________________________  ED COURSE:  As part of my medical decision making, I reviewed the following data within the electronic MEDICAL RECORD NUMBER History obtained from family if available, nursing notes, old chart and ekg, as well as notes from prior ED visits. Patient presented for dysphagia, we will assess with labs and imaging as indicated at this time.   Procedures ____________________________________________   LABS  (pertinent positives/negatives)  Labs Reviewed  CBC WITH DIFFERENTIAL/PLATELET - Abnormal; Notable for the following components:      Result Value   RDW 16.8 (*)    All other components within normal limits  COMPREHENSIVE METABOLIC PANEL - Abnormal; Notable for the following components:   ALT 15 (*)    All other components within normal limits  ACETYLCHOLINE RECEPTOR, BINDING  SEDIMENTATION RATE  C-REACTIVE PROTEIN    RADIOLOGY Images were viewed by me  CT head is unremarkable Brain MRI is negative ____________________________________________  DIFFERENTIAL DIAGNOSIS   CVA, neuromuscular disorder, neck mass, esophageal stenosis, GERD  FINAL ASSESSMENT AND PLAN  Dysphagia   Plan: The patient had presented for difficult to swallowing. Patient's labs were unremarkable. Patient's imaging regarding CT imaging and MRI was negative of the head.  I did discuss with neurology who recommends outpatient workup for possible myasthenia.  Laboratory studies have been sent.  He has been referred for close outpatient follow-up with neurology.   Ulice Dash, MD   Note: This note was generated in part or whole with voice recognition software. Voice recognition is usually quite accurate but there are transcription errors that can and very often do occur. I apologize for any typographical errors that were not detected and corrected.     Emily Filbert, MD 09/12/17 [phone]

## 2017-09-18 ENCOUNTER — Telehealth: Payer: Self-pay | Admitting: Emergency Medicine

## 2017-09-18 LAB — ACETYLCHOLINE RECEPTOR, BINDING: Acety choline binding ab: 11 nmol/L — ABNORMAL HIGH (ref 0.00–0.24)

## 2017-09-18 NOTE — Telephone Encounter (Signed)
Called patient to find out where he is going to follow up with neuro so we can assure results go to them.  I left him a message.

## 2018-03-19 ENCOUNTER — Other Ambulatory Visit: Payer: Self-pay

## 2018-03-19 ENCOUNTER — Ambulatory Visit (INDEPENDENT_AMBULATORY_CARE_PROVIDER_SITE_OTHER): Payer: Managed Care, Other (non HMO)

## 2018-03-19 ENCOUNTER — Ambulatory Visit (HOSPITAL_COMMUNITY)
Admission: EM | Admit: 2018-03-19 | Discharge: 2018-03-19 | Disposition: A | Discharge status: Home / Self Care | Payer: Managed Care, Other (non HMO) | Attending: Family Medicine | Admitting: Family Medicine

## 2018-03-19 DIAGNOSIS — S63259A Unspecified dislocation of unspecified finger, initial encounter: Secondary | ICD-10-CM | POA: Diagnosis not present

## 2018-03-19 DIAGNOSIS — S6991XA Unspecified injury of right wrist, hand and finger(s), initial encounter: Secondary | ICD-10-CM | POA: Diagnosis not present

## 2018-03-19 DIAGNOSIS — S62646A Nondisplaced fracture of proximal phalanx of right little finger, initial encounter for closed fracture: Secondary | ICD-10-CM | POA: Diagnosis not present

## 2018-03-19 MED ORDER — ACETAMINOPHEN 325 MG PO TABS
ORAL_TABLET | ORAL | Status: AC
  Filled 2018-03-19: qty 2

## 2018-03-19 MED ORDER — ACETAMINOPHEN 325 MG PO TABS: 650.0000 mg | ORAL_TABLET | Freq: Once | ORAL | Status: DC

## 2018-03-19 NOTE — Discharge Instructions (Addendum)
Please continue to wear your finger splint until your follow up with a hand specialist. You may use over the counter ibuprofen or acetaminophen as needed for pain.

## 2018-03-19 NOTE — ED Triage Notes (Signed)
Pt states she he was trying to break a fall and hurt his pinky finger. This happened today.

## 2018-04-02 NOTE — ED Provider Notes (Signed)
[facility]   [id number] 03/19/18 Arrival Time: 1325  ASSESSMENT & PLAN:  1. Hand injury, right, initial encounter   2. Finger dislocation, initial encounter   3. Closed nondisplaced fracture of proximal phalanx of right little finger, initial encounter     Imaging: Dg Hand Complete Right  Result Date: 03/19/2018 CLINICAL DATA:  Patient reports falling several hours prior to presentation dislocating the right fifth finger. EXAM: RIGHT HAND - COMPLETE 3+ VIEW COMPARISON:  None. FINDINGS: There is dorsal dislocation of the distal phalanx with respect to the head of the middle phalanx of the fifth finger. No acute fracture is observed. The PIP joint is normal. The other phalanges are normal. IMPRESSION: There is dorsal dislocation of the distal phalanx of the right fifth finger. Electronically Signed   By: David  [place] M.D.   On: 03/19/2018 15:25   Dg Finger Little Right  Result Date: 03/19/2018 CLINICAL DATA:  Postreduction right fifth digit. EXAM: RIGHT LITTLE FINGER 2+V COMPARISON:  03/19/2018. FINDINGS: Interim relocation of the distal phalanx of the right fifth digit. Tiny fracture chip noted along the inferior volar aspect of the distal phalanx of the right fifth digit. IMPRESSION: 1.  Relocation of the distal phalanx of the right fifth digit. 2. Tiny fracture chip noted along the inferior volar aspect of the distal phalanx of the right fifth digit. Electronically Signed   By: Maisie Fus  Register   On: 03/19/2018 15:54     Meds ordered this encounter  Medications  . DISCONTD: acetaminophen (TYLENOL) tablet 650 mg    Follow-up Information    Schedule an appointment as soon as possible for a visit  with Dominica Severin, MD.   Specialty:  Orthopedic Surgery Contact information: [address] [place] [address] [phone]        [facility].   Specialty:  Urgent Care Why:  As needed. Contact information: [address] [place] [address] [phone]         R 5th finger dislocation reduced easily by traction. No anesthesia needed. He tolerated this well with minimal discomfort. F/U as above. Finger splinted.  Reviewed expectations re: course of current medical issues. Questions answered. Outlined signs and symptoms indicating need for more acute intervention. Patient verbalized understanding. After Visit Summary given.  SUBJECTIVE: History from: patient. Tanner James is a 62 y.o. male who reports injury to his R 5th finger after breaking a fall today. "Finger looks out of place." Minimal discomfort. Unable to bend finger fully. No extremity sensation changes or weakness. No self treatment. No specific aggravating or alleviating factors reported. No h/o previous R 5th finger injury. No analgesics needed.  ROS: As per HPI.   OBJECTIVE:  Vitals:   03/19/18 1447 03/19/18 1448  BP: (!) 123/99   Pulse: 71   Resp: 18   Temp: 97.9 F (36.6 C)   TempSrc: Oral   SpO2: 100%   Weight:  63.5 kg    General appearance: alert; no distress Extremities: warm and well perfused; symmetrical with obvious deformity of R 5th finger; localized tenderness over his right proximal 5th finger with mild swelling and no bruising; decreased ROM of R 5th finger CV: brisk extremity capillary refill Skin: warm and dry Neurologic: normal gait; normal symmetric reflexes in all extremities; normal sensation in all extremities Psychological: alert and cooperative; normal mood and affect  No Known Allergies  Past Medical History:  Diagnosis Date  . Complication of anesthesia   .  Concussion    8/17  . Medical history non-contributory   . PONV (postoperative nausea and vomiting)    AFTER COLONOSCOPY   Social History   Socioeconomic History  . Marital status: Married    Spouse name: Not on file  . Number of children: Not on file  . Years of education: Not on file  . Highest education level:  Not on file  Occupational History  . Not on file  Social Needs  . Financial resource strain: Not on file  . Food insecurity:    Worry: Not on file    Inability: Not on file  . Transportation needs:    Medical: Not on file    Non-medical: Not on file  Tobacco Use  . Smoking status: Never Smoker  . Smokeless tobacco: Never Used  Substance and Sexual Activity  . Alcohol use: Yes  . Drug use: No  . Sexual activity: Not on file  Lifestyle  . Physical activity:    Days per week: Not on file    Minutes per session: Not on file  . Stress: Not on file  Relationships  . Social connections:    Talks on phone: Not on file    Gets together: Not on file    Attends religious service: Not on file    Active member of club or organization: Not on file    Attends meetings of clubs or organizations: Not on file    Relationship status: Not on file  Other Topics Concern  . Not on file  Social History Narrative  . Not on file   No family history on file. Past Surgical History:  Procedure Laterality Date  . CATARACT EXTRACTION W/PHACO Right 12/28/2016   Procedure: CATARACT EXTRACTION PHACO AND INTRAOCULAR LENS PLACEMENT (IOC);  Surgeon: Lockie Mola, MD;  Location: [facility] ORS;  Service: Ophthalmology;  Laterality: Right;  [place] 00:29.6 AP% 17.5 CDE 5.19 Fluid Pack lot # [id number] Rexene Edison COLONSCOPY        Mardella Layman, MD 04/02/18 [phone]

## 2019-06-07 ENCOUNTER — Other Ambulatory Visit: Payer: Self-pay

## 2019-06-07 ENCOUNTER — Emergency Department: Payer: Managed Care, Other (non HMO)

## 2019-06-07 ENCOUNTER — Encounter: Payer: Self-pay | Admitting: Emergency Medicine

## 2019-06-07 ENCOUNTER — Inpatient Hospital Stay
Admission: EM | Admit: 2019-06-07 | Discharge: 2019-06-10 | DRG: 481 | Disposition: A | Discharge status: Home / Self Care | Payer: Managed Care, Other (non HMO) | Attending: Internal Medicine | Admitting: Internal Medicine

## 2019-06-07 DIAGNOSIS — Z79899 Other long term (current) drug therapy: Secondary | ICD-10-CM | POA: Diagnosis not present

## 2019-06-07 DIAGNOSIS — S72002A Fracture of unspecified part of neck of left femur, initial encounter for closed fracture: Secondary | ICD-10-CM

## 2019-06-07 DIAGNOSIS — G7000 Myasthenia gravis without (acute) exacerbation: Secondary | ICD-10-CM | POA: Diagnosis present

## 2019-06-07 DIAGNOSIS — N179 Acute kidney failure, unspecified: Secondary | ICD-10-CM | POA: Diagnosis present

## 2019-06-07 DIAGNOSIS — Y92009 Unspecified place in unspecified non-institutional (private) residence as the place of occurrence of the external cause: Secondary | ICD-10-CM

## 2019-06-07 DIAGNOSIS — S72145A Nondisplaced intertrochanteric fracture of left femur, initial encounter for closed fracture: Secondary | ICD-10-CM | POA: Diagnosis present

## 2019-06-07 DIAGNOSIS — I10 Essential (primary) hypertension: Secondary | ICD-10-CM | POA: Diagnosis present

## 2019-06-07 DIAGNOSIS — S72009A Fracture of unspecified part of neck of unspecified femur, initial encounter for closed fracture: Secondary | ICD-10-CM | POA: Diagnosis present

## 2019-06-07 DIAGNOSIS — W1830XA Fall on same level, unspecified, initial encounter: Secondary | ICD-10-CM | POA: Diagnosis present

## 2019-06-07 DIAGNOSIS — Z20828 Contact with and (suspected) exposure to other viral communicable diseases: Secondary | ICD-10-CM | POA: Diagnosis present

## 2019-06-07 DIAGNOSIS — S72002D Fracture of unspecified part of neck of left femur, subsequent encounter for closed fracture with routine healing: Secondary | ICD-10-CM | POA: Diagnosis not present

## 2019-06-07 DIAGNOSIS — Z9841 Cataract extraction status, right eye: Secondary | ICD-10-CM

## 2019-06-07 DIAGNOSIS — T148XXA Other injury of unspecified body region, initial encounter: Secondary | ICD-10-CM

## 2019-06-07 DIAGNOSIS — Z7952 Long term (current) use of systemic steroids: Secondary | ICD-10-CM | POA: Diagnosis not present

## 2019-06-07 DIAGNOSIS — Z23 Encounter for immunization: Secondary | ICD-10-CM | POA: Diagnosis not present

## 2019-06-07 DIAGNOSIS — Z961 Presence of intraocular lens: Secondary | ICD-10-CM | POA: Diagnosis present

## 2019-06-07 HISTORY — DX: Myasthenia gravis without (acute) exacerbation: G70.00

## 2019-06-07 LAB — CBC WITH DIFFERENTIAL/PLATELET
Abs Immature Granulocytes: 0.03 10*3/uL (ref 0.00–0.07)
Basophils Absolute: 0 10*3/uL (ref 0.0–0.1)
Basophils Relative: 0 %
Eosinophils Absolute: 0 10*3/uL (ref 0.0–0.5)
Eosinophils Relative: 0 %
HCT: 41.8 % (ref 39.0–52.0)
Hemoglobin: 13.8 g/dL (ref 13.0–17.0)
Immature Granulocytes: 0 %
Lymphocytes Relative: 5 %
Lymphs Abs: 0.5 10*3/uL — ABNORMAL LOW (ref 0.7–4.0)
MCH: 32.3 pg (ref 26.0–34.0)
MCHC: 33 g/dL (ref 30.0–36.0)
MCV: 97.9 fL (ref 80.0–100.0)
Monocytes Absolute: 0.4 10*3/uL (ref 0.1–1.0)
Monocytes Relative: 4 %
Neutro Abs: 8.4 10*3/uL — ABNORMAL HIGH (ref 1.7–7.7)
Neutrophils Relative %: 91 %
Platelets: 248 10*3/uL (ref 150–400)
RBC: 4.27 MIL/uL (ref 4.22–5.81)
RDW: 13 % (ref 11.5–15.5)
WBC: 9.3 10*3/uL (ref 4.0–10.5)
nRBC: 0 % (ref 0.0–0.2)

## 2019-06-07 LAB — BASIC METABOLIC PANEL
Anion gap: 9 (ref 5–15)
BUN: 20 mg/dL (ref 8–23)
CO2: 26 mmol/L (ref 22–32)
Calcium: 8.6 mg/dL — ABNORMAL LOW (ref 8.9–10.3)
Chloride: 104 mmol/L (ref 98–111)
Creatinine, Ser: 1.42 mg/dL — ABNORMAL HIGH (ref 0.61–1.24)
GFR calc Af Amer: >60 mL/min (ref >60)
GFR calc non Af Amer: 53 mL/min — ABNORMAL LOW (ref >60)
Glucose, Bld: 129 mg/dL — ABNORMAL HIGH (ref 70–99)
Potassium: 4.1 mmol/L (ref 3.5–5.1)
Sodium: 139 mmol/L (ref 135–145)

## 2019-06-07 LAB — TYPE AND SCREEN
ABO/RH(D): AB POS
Antibody Screen: NEGATIVE

## 2019-06-07 LAB — RESPIRATORY PANEL BY RT PCR (FLU A&B, COVID)
Influenza A by PCR: NEGATIVE
Influenza B by PCR: NEGATIVE
SARS Coronavirus 2 by RT PCR: NEGATIVE

## 2019-06-07 LAB — APTT: aPTT: 24 seconds (ref 24–36)

## 2019-06-07 LAB — PROTIME-INR
INR: 1 (ref 0.8–1.2)
Prothrombin Time: 13.3 seconds (ref 11.4–15.2)

## 2019-06-07 MED ORDER — HYDROMORPHONE HCL 1 MG/ML IJ SOLN: 1.0000 mg | Freq: Once | INTRAMUSCULAR | Status: AC

## 2019-06-07 MED ORDER — HYDROMORPHONE HCL 1 MG/ML IJ SOLN
INTRAMUSCULAR | Status: AC
  Administered 2019-06-07: 1 mg via INTRAVENOUS
  Filled 2019-06-07: qty 1

## 2019-06-07 MED ORDER — SENNA 8.6 MG PO TABS: 1.0000 | ORAL_TABLET | Freq: Two times a day (BID) | ORAL | Status: DC

## 2019-06-07 MED ORDER — HYDROMORPHONE HCL 1 MG/ML IJ SOLN
1.0000 mg | INTRAMUSCULAR | Status: DC | PRN
  Administered 2019-06-07 – 2019-06-08 (×3): 1 mg via INTRAVENOUS
  Filled 2019-06-07 (×3): qty 1

## 2019-06-07 MED ORDER — CEFAZOLIN SODIUM-DEXTROSE 1-4 GM/50ML-% IV SOLN
1.0000 g | Freq: Once | INTRAVENOUS | Status: AC
  Administered 2019-06-08: 2 g via INTRAVENOUS
  Filled 2019-06-07 (×2): qty 50

## 2019-06-07 MED ORDER — HYDROMORPHONE HCL 1 MG/ML IJ SOLN
1.0000 mg | INTRAMUSCULAR | Status: DC | PRN
  Administered 2019-06-07: 1 mg via INTRAVENOUS
  Filled 2019-06-07: qty 1

## 2019-06-07 MED ORDER — HYDROMORPHONE HCL 1 MG/ML IJ SOLN
1.0000 mg | Freq: Once | INTRAMUSCULAR | Status: AC
  Administered 2019-06-07: 1 mg via INTRAVENOUS
  Filled 2019-06-07: qty 1

## 2019-06-07 MED ORDER — PYRIDOSTIGMINE BROMIDE 60 MG PO TABS
60.0000 mg | ORAL_TABLET | Freq: Three times a day (TID) | ORAL | Status: DC
  Administered 2019-06-08 – 2019-06-10 (×6): 60 mg via ORAL
  Filled 2019-06-07 (×10): qty 1

## 2019-06-07 MED ORDER — SODIUM CHLORIDE 0.9 % IV BOLUS
500.0000 mL | Freq: Once | INTRAVENOUS | Status: AC
  Administered 2019-06-07: 500 mL via INTRAVENOUS

## 2019-06-07 MED ORDER — ACETAMINOPHEN 325 MG PO TABS: 650.0000 mg | ORAL_TABLET | Freq: Four times a day (QID) | ORAL | Status: DC | PRN

## 2019-06-07 MED ORDER — MYCOPHENOLATE MOFETIL 250 MG PO CAPS
1000.0000 mg | ORAL_CAPSULE | Freq: Two times a day (BID) | ORAL | Status: DC
  Administered 2019-06-09 – 2019-06-10 (×3): 1000 mg via ORAL
  Filled 2019-06-07 (×7): qty 4

## 2019-06-07 MED ORDER — TETANUS-DIPHTH-ACELL PERTUSSIS 5-2.5-18.5 LF-MCG/0.5 IM SUSP
0.5000 mL | Freq: Once | INTRAMUSCULAR | Status: AC
Start: 2019-06-07 — End: 2019-06-07
  Administered 2019-06-07: 0.5 mL via INTRAMUSCULAR
  Filled 2019-06-07: qty 0.5

## 2019-06-07 MED ORDER — MORPHINE SULFATE (PF) 2 MG/ML IV SOLN
1.0000 mg | INTRAVENOUS | Status: DC | PRN
  Administered 2019-06-08: 1 mg via INTRAVENOUS
  Filled 2019-06-07: qty 1

## 2019-06-07 MED ORDER — PREDNISONE 10 MG PO TABS
10.0000 mg | ORAL_TABLET | Freq: Every day | ORAL | Status: DC
  Administered 2019-06-09 – 2019-06-10 (×2): 10 mg via ORAL
  Filled 2019-06-07 (×2): qty 1

## 2019-06-07 NOTE — H&P (Addendum)
History and Physical    Tanner James OFB:[id number] DOB: May 26, 1956 DOA: 06/07/2019  PCP: [facility]  Patient coming from: Home, normally lives with his wife, son is at bedside  I have personally briefly reviewed patient's old medical records in [facility]  Chief Complaint: Fall  HPI: Tanner James is a 63 y.o. 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 He was going down the stairs into his garage today when he tried to skip a few steps to avoid some boxes.  However he did not hold onto the wall and had a fall putting all of his weight onto his left hip.  He was not able to get up immediately but eventually had to crawl his way up stairs before his wife who was asleep heard him yelling for help. He denies any dizziness or lightheadedness.  Reports that normally he has a steady gait and does not require any assistance with ambulation.  Denies any chest pain or shortness of breath.  ED Course: Patient is afebrile and initially was hypertensive up to 156/85 on room air.  CBC showed no leukocytosis or anemia.  Glucose of 129, creatinine of 1.42 which is elevated from 1.08 about a year ago.  Chest x-ray is negative.  Hip x-ray showed comminuted intertrochanteric fracture of the proximal left femur without any significant displacement or angulation.  He was given 500 cc of bolus and up to 2 mg of Dilaudid in the ED but still had some spastic pain of his left hip during my evaluation.  ED physician has discussed case with orthopedic surgeon Dr. Allena Katz who will plan for surgery in the morning.  Review of Systems:  Constitutional: No Weight Change, No Fever ENT/Mouth: No sore throat, No Rhinorrhea Eyes: No Eye Pain, No acute Vision Changes Cardiovascular: No Chest Pain, no SOB Respiratory: No Cough, No Sputum, No Wheezing, no Dyspnea  Gastrointestinal: No Nausea, No Vomiting, No Diarrhea, No  Constipation Genitourinary: no Urinary Incontinence, Musculoskeletal: No Arthralgias, +_ Myalgias Skin: No Skin Lesions, No Pruritus, Neuro: no Weakness, No Numbness,  No Loss of Consciousness, No Syncope Psych: No Anxiety/Panic, No Depression, no decrease appetite Heme/Lymph: No Bruising, No Bleeding  Past Medical History:  Diagnosis Date  . Complication of anesthesia   . Concussion    8/17  . Medical history non-contributory   . Myasthenia gravis, adult form (HCC)   . PONV (postoperative nausea and vomiting)    AFTER COLONOSCOPY    Past Surgical History:  Procedure Laterality Date  . CATARACT EXTRACTION W/PHACO Right 12/28/2016   Procedure: CATARACT EXTRACTION PHACO AND INTRAOCULAR LENS PLACEMENT (IOC);  Surgeon: Lockie Mola, MD;  Location: [facility] ORS;  Service: Ophthalmology;  Laterality: Right;  [place] 00:29.6 AP% 17.5 CDE 5.19 Fluid Pack lot # [id number] H  . COLONSCOPY       reports that he has never smoked. He has never used smokeless tobacco. He reports current alcohol use. He reports that he does not use drugs.  No Known Allergies  No family history of myasthenia gravis.  Prior to Admission medications   Medication Sig Start Date End Date Taking? Authorizing Provider  mycophenolate (CELLCEPT) 500 MG tablet Take 1,000 mg by mouth 2 (two) times daily. 06/03/19  Yes [provider]  predniSONE (DELTASONE) 10 MG tablet Take 10 mg by mouth daily. 05/27/19  Yes [provider]  pyridostigmine (MESTINON) 60 MG tablet Take 60 mg by mouth 3 (three) times  daily. 06/02/19  Yes [provider]    Physical Exam: Vitals:   06/07/19 1533 06/07/19 1534 06/07/19 1600  BP: (!) 156/85  134/72  Pulse: 92  85  Resp: 18  19  Temp: 97.8 F (36.6 C)    TempSrc: Oral    SpO2: 98%  96%  Weight:  63.5 kg   Height:  5\' 8"  (1.727 m)     Constitutional: NAD, calm, comfortable, male laying at 20 degree incline in bed Vitals:   06/07/19 1533 06/07/19 1534  06/07/19 1600  BP: (!) 156/85  134/72  Pulse: 92  85  Resp: 18  19  Temp: 97.8 F (36.6 C)    TempSrc: Oral    SpO2: 98%  96%  Weight:  63.5 kg   Height:  5\' 8"  (1.727 m)    Eyes:  lids and conjunctivae normal ENMT: Mucous membranes are moist. Normal dentition.  Neck: normal, supple Respiratory: clear to auscultation bilaterally, no wheezing, no crackles. Normal respiratory effort.  Cardiovascular: Regular rate and rhythm, no murmurs / rubs / gallops. No extremity edema.  Abdomen: no tenderness,  Bowel sounds positive.  Musculoskeletal: no clubbing / cyanosis. No joint deformity upper and lower extremities.  no contractures. Normal muscle tone.  Skin: no rashes, lesions, ulcers. No induration Neurologic: CN 2-12 grossly intact. Sensation intact. Unable to lift bilateral lower extremity due to intolerance of pain in the left hip.   Psychiatric: Normal judgment and insight. Alert and oriented x 3. Normal mood.    Labs on Admission: I have personally reviewed following labs and imaging studies  CBC: Recent Labs  Lab 06/07/19 1554  WBC 9.3  NEUTROABS 8.4*  HGB 13.8  HCT 41.8  MCV 97.9  PLT 248   Basic Metabolic Panel: Recent Labs  Lab 06/07/19 1554  NA 139  K 4.1  CL 104  CO2 26  GLUCOSE 129*  BUN 20  CREATININE 1.42*  CALCIUM 8.6*   GFR: Estimated Creatinine Clearance: 48.4 mL/min (A) (by C-G formula based on SCr of 1.42 mg/dL (H)). Liver Function Tests: No results for input(s): AST, ALT, ALKPHOS, BILITOT, PROT, ALBUMIN in the last 168 hours. No results for input(s): LIPASE, AMYLASE in the last 168 hours. No results for input(s): AMMONIA in the last 168 hours. Coagulation Profile: Recent Labs  Lab 06/07/19 1554  INR 1.0   Cardiac Enzymes: No results for input(s): CKTOTAL, CKMB, CKMBINDEX, TROPONINI in the last 168 hours. BNP (last 3 results) No results for input(s): PROBNP in the last 8760 hours. HbA1C: No results for input(s): HGBA1C in the last 72  hours. CBG: No results for input(s): GLUCAP in the last 168 hours. Lipid Profile: No results for input(s): CHOL, HDL, LDLCALC, TRIG, CHOLHDL, LDLDIRECT in the last 72 hours. Thyroid Function Tests: No results for input(s): TSH, T4TOTAL, FREET4, T3FREE, THYROIDAB in the last 72 hours. Anemia Panel: No results for input(s): VITAMINB12, FOLATE, FERRITIN, TIBC, IRON, RETICCTPCT in the last 72 hours. Urine analysis: No results found for: COLORURINE, APPEARANCEUR, LABSPEC, PHURINE, GLUCOSEU, HGBUR, BILIRUBINUR, KETONESUR, PROTEINUR, UROBILINOGEN, NITRITE, LEUKOCYTESUR  Radiological Exams on Admission: DG Chest 1 View  Result Date: 06/07/2019 CLINICAL DATA:  Mechanical fall today. Left hip pain. Left femur fracture. EXAM: CHEST  1 VIEW COMPARISON:  None. FINDINGS: The heart size and mediastinal contours are within normal limits. Both lungs are clear. The visualized skeletal structures are unremarkable. IMPRESSION: Normal exam. Electronically Signed   By: Francene BoyersJames  Maxwell M.D.   On: 06/07/2019 16:55  DG Hip Unilat W or Wo Pelvis 2-3 Views Left  Result Date: 06/07/2019 CLINICAL DATA:  Can ankle fall this afternoon. Complaining of left hip pain and inability to move the left leg. EXAM: DG HIP (WITH OR WITHOUT PELVIS) 2-3V LEFT COMPARISON:  None. FINDINGS: Comminuted intertrochanteric fracture of the proximal left femur. Primary fracture components nondisplaced and nonangulated. There is a mildly displaced fracture component from the lesser trochanter. No other fractures. Hip joints, SI joints and pubic symphysis are normally aligned. Soft tissues are unremarkable. IMPRESSION: 1. Comminuted intertrochanteric fracture of the proximal left femur with no significant displacement or angulation. Electronically Signed   By: Lajean Manes M.D.   On: 06/07/2019 16:54    EKG: Independently reviewed.   Assessment/Plan  Comminuted intertrochanteric fracture of the proximal left femur -Ortho to plan for surgery  in the morning -As needed Tylenol for mild pain, morphine for moderate pain, Dilaudid for severe pain -N.p.o. after midnight -PT after surgery -Transition team consulted for rehab placement  AKI -Creatinine of 1.4 on admission - will recheck BMP in the morning following fluids  Myasthenia gravis affecting bulbar muscles -Speech eval -Continue rivastigmine, cellcept and prednisone  DVT prophylaxis: SCD Code Status:Full Family Communication: Plan discussed with patient and son at bedside  disposition Plan: Home with at least 2 midnight stays  Consults called: Ortho Admission status: inpatient  Dorraine Ellender T Tonea Leiphart DO [facility]   If 7PM-7AM, please contact night-coverage [email or web address] Password [email or web address]  06/07/2019, 8:02 PM

## 2019-06-07 NOTE — Progress Notes (Signed)
Full consult note and discussion with patient to follow tomorrow AM.  Called by ED staff. Imaging reviewed.  - Plan for surgery tomorrow AM for L hip IM nail.  - NPO after midnight - Hold anticoagulation - Admit to Hospitalist team.

## 2019-06-07 NOTE — ED Notes (Signed)
Called from Radiology, pt was refusing to be moved to x-ray table because he needed more pain medications. Dr. Jari Pigg made aware. See orders.

## 2019-06-07 NOTE — ED Provider Notes (Signed)
[facility] Emergency Department Provider Note  ____________________________________________   None    (approximate)  I have reviewed the triage vital signs and the nursing notes.   HISTORY  Chief Complaint Fall    HPI Tanner ReekBrian James is a 63 y.o. male with myasthenia gravis who presents with fall.  Patient was outside trying to jump over some buckets when he had a mechanical fall onto his left hip and a mild amount onto his left elbow.  He states he did not hit his head did not lose consciousness did not hurt his neck.  He said no numbness or tingling in his arms.  He states he is having severe pain in his left hip that is constant, worse with trying to move the leg, better at rest.  He is not on any blood thinners.          Past Medical History:  Diagnosis Date  . Complication of anesthesia   . Concussion    8/17  . Medical history non-contributory   . PONV (postoperative nausea and vomiting)    AFTER COLONOSCOPY    There are no problems to display for this patient.   Past Surgical History:  Procedure Laterality Date  . CATARACT EXTRACTION W/PHACO Right 12/28/2016   Procedure: CATARACT EXTRACTION PHACO AND INTRAOCULAR LENS PLACEMENT (IOC);  Surgeon: Lockie MolaBrasington, Chadwick, MD;  Location: [facility] ORS;  Service: Ophthalmology;  Laterality: Right;  [place] 00:29.6 AP% 17.5 CDE 5.19 Fluid Pack lot # [id number] H  . COLONSCOPY      Prior to Admission medications   Medication Sig Start Date End Date Taking? Authorizing Provider  aspirin EC 325 MG tablet Take 650 mg by mouth daily as needed for mild pain.    [provider]  naproxen (NAPROSYN) 500 MG tablet Take 1 tablet (500 mg total) by mouth 2 (two) times daily with a meal. Patient not taking: Reported on 12/22/2016 08/14/16   Lutricia Feiloemer, William P, PA-C    Allergies Patient has no known allergies.  No family history on file.  Social History Social History   Tobacco Use  . Smoking status: Never  Smoker  . Smokeless tobacco: Never Used  Substance Use Topics  . Alcohol use: Yes  . Drug use: No      Review of Systems Constitutional: No fever/chills Eyes: No visual changes. ENT: No sore throat. Cardiovascular: Denies chest pain. Respiratory: Denies shortness of breath. Gastrointestinal: No abdominal pain.  No nausea, no vomiting.  No diarrhea.  No constipation. Genitourinary: Negative for dysuria. Musculoskeletal: Negative for back pain.  Left hip pain Skin: Negative for rash. Neurological: Negative for headaches, focal weakness or numbness. All other ROS negative ____________________________________________   PHYSICAL EXAM:  VITAL SIGNS: Blood pressure 134/72, pulse 85, temperature 97.8 F (36.6 C), temperature source Oral, resp. rate 19, height 5\' 8"  (1.727 m), weight 63.5 kg, SpO2 96 %.  Constitutional: Alert and oriented. GCS 15  Eyes: Conjunctivae are normal. EOMI. Head: Atraumatic. Nose: No congestion/rhinnorhea. Mouth/Throat: Mucous membranes are moist.   Neck: No stridor. Trachea Midline. FROM Cardiovascular: Normal rate, regular rhythm. Grossly normal heart sounds.  Good peripheral circulation. No chest wall tenderness Respiratory: Normal respiratory effort.  No retractions. Lungs CTAB. Gastrointestinal: Soft and nontender. No distention. No abdominal bruits.  Musculoskeletal:   RUE: No point tenderness, deformity or other signs of injury. Radial pulse intact. Neuro intact. Full ROM in joint. LUE: Small abrasion on the left elbow, no deformity or other signs of injury. Radial pulse intact.  Neuro intact. Full ROM in joints RLE: No point tenderness, deformity or other signs of injury. DP pulse intact. Neuro intact. Full ROM in joints. LLE: Leg held in flexion with tenderness on the left hip DP pulse intact. Neuro intact.  Decreased range of motion secondary to pain. Neurologic:  Normal speech and language. No gross focal neurologic deficits are appreciated.    Skin:  Skin is warm, dry and intact. No rash noted. Psychiatric: Mood and affect are normal. Speech and behavior are normal. GU: Deferred   ____________________________________________   LABS (all labs ordered are listed, but only abnormal results are displayed)  Labs Reviewed  CBC WITH DIFFERENTIAL/PLATELET - Abnormal; Notable for the following components:      Result Value   Neutro Abs 8.4 (*)    Lymphs Abs 0.5 (*)    All other components within normal limits  BASIC METABOLIC PANEL - Abnormal; Notable for the following components:   Glucose, Bld 129 (*)    Creatinine, Ser 1.42 (*)    Calcium 8.6 (*)    GFR calc non Af Amer 53 (*)    All other components within normal limits  PROTIME-INR  APTT  TYPE AND SCREEN   ____________________________________________   ED ECG REPORT I, Vanessa Chickaloon, the attending physician, personally viewed and interpreted this ECG.  EKG is normal sinus rate of 81, no ST elevations, no T wave inversions, short PR interval ____________________________________________  RADIOLOGY Robert Bellow, personally viewed and evaluated these images (plain radiographs) as part of my medical decision making, as well as reviewing the written report by the radiologist.  ED MD interpretation: X-ray shows intertrochanter fracture on the left.  Official radiology report(s): DG Chest 1 View  Result Date: 06/07/2019 CLINICAL DATA:  Mechanical fall today. Left hip pain. Left femur fracture. EXAM: CHEST  1 VIEW COMPARISON:  None. FINDINGS: The heart size and mediastinal contours are within normal limits. Both lungs are clear. The visualized skeletal structures are unremarkable. IMPRESSION: Normal exam. Electronically Signed   By: Lorriane Shire M.D.   On: 06/07/2019 16:55   DG Hip Unilat W or Wo Pelvis 2-3 Views Left  Result Date: 06/07/2019 CLINICAL DATA:  Can ankle fall this afternoon. Complaining of left hip pain and inability to move the left leg. EXAM: DG HIP  (WITH OR WITHOUT PELVIS) 2-3V LEFT COMPARISON:  None. FINDINGS: Comminuted intertrochanteric fracture of the proximal left femur. Primary fracture components nondisplaced and nonangulated. There is a mildly displaced fracture component from the lesser trochanter. No other fractures. Hip joints, SI joints and pubic symphysis are normally aligned. Soft tissues are unremarkable. IMPRESSION: 1. Comminuted intertrochanteric fracture of the proximal left femur with no significant displacement or angulation. Electronically Signed   By: Lajean Manes M.D.   On: 06/07/2019 16:54    ____________________________________________   PROCEDURES  Procedure(s) performed (including Critical Care):  Procedures   ____________________________________________   INITIAL IMPRESSION / ASSESSMENT AND PLAN / ED COURSE   Skylor Schnapp was evaluated in Emergency Department on 06/07/2019 for the symptoms described in the history of present illness. He was evaluated in the context of the global COVID-19 pandemic, which necessitated consideration that the patient might be at risk for infection with the SARS-CoV-2 virus that causes COVID-19. Institutional protocols and algorithms that pertain to the evaluation of patients at risk for COVID-19 are in a state of rapid change based on information released by regulatory bodies including the CDC and federal and state organizations. These policies and  algorithms were followed during the patient's care in the ED.    Patient presents with a mechanical fall.  Exam is most concerning for hip fracture of his dislocation.  Will get x-ray to further evaluate.  Patient is good distal pulse are currently vascularly intact at this time.  No chest wall pain or abdominal pain to suggest intrathoracic or intra-abdominal injuries.  Did not hit his head to suggest intracranial hemorrhage.  No C-spine tenderness did not hit his neck to suggest cervical fracture.  Preop labs reassuring.  Slight AKI we  will give a little bit of fluid.  X-ray consistent with intertrochanter fracture.  Will discuss with the orthopedic team  D/w Dr. Kathryne Eriksson. at midnight.  Covid test pending for preop.  We will plan to operate tomorrow.  Discuss with hospital for admission.   ____________________________________________   FINAL CLINICAL IMPRESSION(S) / ED DIAGNOSES   Final diagnoses:  Closed nondisplaced intertrochanteric fracture of left femur, initial encounter (HCC)  AKI (acute kidney injury) (HCC)      MEDICATIONS GIVEN DURING THIS VISIT:  Medications  HYDROmorphone (DILAUDID) injection 1 mg (has no administration in time range)  Tdap (BOOSTRIX) injection 0.5 mL (0.5 mLs Intramuscular Given 06/07/19 1552)  HYDROmorphone (DILAUDID) injection 1 mg (1 mg Intravenous Given 06/07/19 1552)  HYDROmorphone (DILAUDID) injection 1 mg (1 mg Intravenous Given 06/07/19 1625)  sodium chloride 0.9 % bolus 500 mL (500 mLs Intravenous New Bag/Given 06/07/19 1715)     ED Discharge Orders    None       Note:  This document was prepared using Dragon voice recognition software and may include unintentional dictation errors.   Concha Se, MD 06/07/19 [phone]

## 2019-06-07 NOTE — ED Triage Notes (Addendum)
Pt via EMS from home for a mechanical fall around 1430. Pt landed on his L hip. Pt c/o of pain in the L hip and says he is unable to move that L leg. Denies LOC or head injury.   Pt was given 50 of fentayl and 4 of Zofran en route

## 2019-06-08 ENCOUNTER — Inpatient Hospital Stay: Payer: Managed Care, Other (non HMO) | Admitting: Anesthesiology

## 2019-06-08 ENCOUNTER — Inpatient Hospital Stay: Payer: Managed Care, Other (non HMO)

## 2019-06-08 ENCOUNTER — Encounter
Admission: EM | Disposition: A | Discharge status: Home / Self Care | Payer: Self-pay | Source: Home / Self Care | Attending: Internal Medicine

## 2019-06-08 DIAGNOSIS — S72145A Nondisplaced intertrochanteric fracture of left femur, initial encounter for closed fracture: Secondary | ICD-10-CM

## 2019-06-08 HISTORY — PX: INTRAMEDULLARY (IM) NAIL INTERTROCHANTERIC: SHX5875

## 2019-06-08 LAB — BASIC METABOLIC PANEL
Anion gap: 7 (ref 5–15)
BUN: 15 mg/dL (ref 8–23)
CO2: 25 mmol/L (ref 22–32)
Calcium: 8.3 mg/dL — ABNORMAL LOW (ref 8.9–10.3)
Chloride: 106 mmol/L (ref 98–111)
Creatinine, Ser: 0.9 mg/dL (ref 0.61–1.24)
GFR calc Af Amer: >60 mL/min (ref >60)
GFR calc non Af Amer: >60 mL/min (ref >60)
Glucose, Bld: 110 mg/dL — ABNORMAL HIGH (ref 70–99)
Potassium: 3.6 mmol/L (ref 3.5–5.1)
Sodium: 138 mmol/L (ref 135–145)

## 2019-06-08 LAB — CBC
HCT: 38.7 % — ABNORMAL LOW (ref 39.0–52.0)
Hemoglobin: 12.9 g/dL — ABNORMAL LOW (ref 13.0–17.0)
MCH: 32.8 pg (ref 26.0–34.0)
MCHC: 33.3 g/dL (ref 30.0–36.0)
MCV: 98.5 fL (ref 80.0–100.0)
Platelets: 217 10*3/uL (ref 150–400)
RBC: 3.93 MIL/uL — ABNORMAL LOW (ref 4.22–5.81)
RDW: 12.9 % (ref 11.5–15.5)
WBC: 9.4 10*3/uL (ref 4.0–10.5)
nRBC: 0 % (ref 0.0–0.2)

## 2019-06-08 LAB — SURGICAL PCR SCREEN
MRSA, PCR: NEGATIVE
Staphylococcus aureus: NEGATIVE

## 2019-06-08 LAB — HIV ANTIBODY (ROUTINE TESTING W REFLEX): HIV Screen 4th Generation wRfx: NONREACTIVE

## 2019-06-08 SURGERY — FIXATION, FRACTURE, INTERTROCHANTERIC, WITH INTRAMEDULLARY ROD
Anesthesia: Monitor Anesthesia Care | Laterality: Left

## 2019-06-08 MED ORDER — ONDANSETRON HCL 4 MG PO TABS: 4.0000 mg | ORAL_TABLET | Freq: Four times a day (QID) | ORAL | Status: DC | PRN

## 2019-06-08 MED ORDER — ACETAMINOPHEN 325 MG PO TABS: 325.0000 mg | ORAL_TABLET | ORAL | Status: DC | PRN

## 2019-06-08 MED ORDER — PROPOFOL 500 MG/50ML IV EMUL
INTRAVENOUS | Status: DC | PRN
  Administered 2019-06-08: 50 ug/kg/min via INTRAVENOUS

## 2019-06-08 MED ORDER — ONDANSETRON HCL 4 MG/2ML IJ SOLN
4.0000 mg | Freq: Four times a day (QID) | INTRAMUSCULAR | Status: DC | PRN
  Filled 2019-06-08: qty 2

## 2019-06-08 MED ORDER — METOCLOPRAMIDE HCL 10 MG PO TABS: 5.0000 mg | ORAL_TABLET | Freq: Three times a day (TID) | ORAL | Status: DC | PRN

## 2019-06-08 MED ORDER — KETAMINE HCL 50 MG/ML IJ SOLN: INTRAMUSCULAR | Status: DC | PRN

## 2019-06-08 MED ORDER — SODIUM CHLORIDE 0.9 % IV SOLN
INTRAVENOUS | Status: DC
  Administered 2019-06-08: 13:00:00 via INTRAVENOUS

## 2019-06-08 MED ORDER — ACETAMINOPHEN 500 MG PO TABS
1000.0000 mg | ORAL_TABLET | Freq: Three times a day (TID) | ORAL | Status: AC
  Administered 2019-06-08 – 2019-06-09 (×3): 1000 mg via ORAL
  Filled 2019-06-08 (×3): qty 2

## 2019-06-08 MED ORDER — ONDANSETRON HCL 4 MG/2ML IJ SOLN
INTRAMUSCULAR | Status: DC | PRN
  Administered 2019-06-08: 4 mg via INTRAVENOUS

## 2019-06-08 MED ORDER — SENNOSIDES-DOCUSATE SODIUM 8.6-50 MG PO TABS: 1.0000 | ORAL_TABLET | Freq: Every evening | ORAL | Status: DC | PRN

## 2019-06-08 MED ORDER — METHOCARBAMOL 1000 MG/10ML IJ SOLN
500.0000 mg | Freq: Four times a day (QID) | INTRAVENOUS | Status: DC | PRN
  Filled 2019-06-08: qty 5

## 2019-06-08 MED ORDER — OXYCODONE HCL 5 MG PO TABS
2.5000 mg | ORAL_TABLET | ORAL | Status: DC | PRN
  Administered 2019-06-09: 5 mg via ORAL
  Filled 2019-06-08: qty 1

## 2019-06-08 MED ORDER — FLEET ENEMA 7-19 GM/118ML RE ENEM: 1.0000 | ENEMA | Freq: Once | RECTAL | Status: DC | PRN

## 2019-06-08 MED ORDER — LACTATED RINGERS IV SOLN
INTRAVENOUS | Status: DC
  Administered 2019-06-08: 08:00:00 via INTRAVENOUS

## 2019-06-08 MED ORDER — MEPERIDINE HCL 50 MG/ML IJ SOLN: 6.2500 mg | INTRAMUSCULAR | Status: DC | PRN

## 2019-06-08 MED ORDER — HYDROMORPHONE HCL 1 MG/ML IJ SOLN
0.2500 mg | INTRAMUSCULAR | Status: DC | PRN
  Administered 2019-06-08 – 2019-06-10 (×7): 0.5 mg via INTRAVENOUS
  Filled 2019-06-08 (×7): qty 1

## 2019-06-08 MED ORDER — FENTANYL CITRATE (PF) 100 MCG/2ML IJ SOLN: 25.0000 ug | INTRAMUSCULAR | Status: DC | PRN

## 2019-06-08 MED ORDER — CEFAZOLIN SODIUM-DEXTROSE 1-4 GM/50ML-% IV SOLN
1.0000 g | Freq: Four times a day (QID) | INTRAVENOUS | Status: AC
  Administered 2019-06-08 – 2019-06-09 (×3): 1 g via INTRAVENOUS
  Filled 2019-06-08 (×3): qty 50

## 2019-06-08 MED ORDER — METOCLOPRAMIDE HCL 5 MG/ML IJ SOLN: 5.0000 mg | Freq: Three times a day (TID) | INTRAMUSCULAR | Status: DC | PRN

## 2019-06-08 MED ORDER — BISACODYL 10 MG RE SUPP
10.0000 mg | Freq: Every day | RECTAL | Status: DC | PRN
  Filled 2019-06-08: qty 1

## 2019-06-08 MED ORDER — KETAMINE HCL 50 MG/ML IJ SOLN
INTRAMUSCULAR | Status: DC | PRN
  Administered 2019-06-08: 50 mg via INTRAVENOUS
  Administered 2019-06-08: 25 mg via INTRAVENOUS

## 2019-06-08 MED ORDER — SODIUM CHLORIDE 0.9 % IR SOLN
Status: DC | PRN
  Administered 2019-06-08: 1000 mL

## 2019-06-08 MED ORDER — ACETAMINOPHEN 325 MG PO TABS: 325.0000 mg | ORAL_TABLET | Freq: Four times a day (QID) | ORAL | Status: DC | PRN

## 2019-06-08 MED ORDER — MIDAZOLAM HCL 5 MG/5ML IJ SOLN
INTRAMUSCULAR | Status: DC | PRN
  Administered 2019-06-08: 2 mg via INTRAVENOUS

## 2019-06-08 MED ORDER — TRAMADOL HCL 50 MG PO TABS: 50.0000 mg | ORAL_TABLET | Freq: Four times a day (QID) | ORAL | Status: DC | PRN

## 2019-06-08 MED ORDER — BUPIVACAINE HCL (PF) 0.5 % IJ SOLN
INTRAMUSCULAR | Status: DC | PRN
  Administered 2019-06-08: 2 mL

## 2019-06-08 MED ORDER — PROMETHAZINE HCL 25 MG/ML IJ SOLN: 6.2500 mg | INTRAMUSCULAR | Status: DC | PRN

## 2019-06-08 MED ORDER — DOCUSATE SODIUM 100 MG PO CAPS
100.0000 mg | ORAL_CAPSULE | Freq: Two times a day (BID) | ORAL | Status: DC
  Administered 2019-06-08 – 2019-06-10 (×4): 100 mg via ORAL
  Filled 2019-06-08 (×4): qty 1

## 2019-06-08 MED ORDER — TRAZODONE HCL 50 MG PO TABS: 50.0000 mg | ORAL_TABLET | Freq: Every evening | ORAL | Status: DC | PRN

## 2019-06-08 MED ORDER — HYDROCODONE-ACETAMINOPHEN 7.5-325 MG PO TABS
1.0000 | ORAL_TABLET | Freq: Once | ORAL | Status: DC | PRN
  Filled 2019-06-08: qty 1

## 2019-06-08 MED ORDER — METHOCARBAMOL 500 MG PO TABS
500.0000 mg | ORAL_TABLET | Freq: Four times a day (QID) | ORAL | Status: DC | PRN
  Administered 2019-06-08: 21:00:00 500 mg via ORAL
  Filled 2019-06-08: qty 1

## 2019-06-08 MED ORDER — OXYCODONE HCL 5 MG PO TABS: 5.0000 mg | ORAL_TABLET | ORAL | Status: DC | PRN

## 2019-06-08 MED ORDER — ENOXAPARIN SODIUM 40 MG/0.4ML ~~LOC~~ SOLN
40.0000 mg | SUBCUTANEOUS | Status: DC
  Administered 2019-06-09 – 2019-06-10 (×2): 40 mg via SUBCUTANEOUS
  Filled 2019-06-08 (×2): qty 0.4

## 2019-06-08 MED ORDER — ACETAMINOPHEN 160 MG/5ML PO SOLN
325.0000 mg | ORAL | Status: DC | PRN
  Filled 2019-06-08: qty 20.3

## 2019-06-08 MED ORDER — BUPIVACAINE LIPOSOME 1.3 % IJ SUSP
INTRAMUSCULAR | Status: DC | PRN
  Administered 2019-06-08: 50 mL

## 2019-06-08 SURGICAL SUPPLY — 51 items
"PENCIL ELECTRO HAND CTR " (MISCELLANEOUS) ×1 IMPLANT
BIT DRILL AO GAMMA 4.2X340 (BIT) ×1 IMPLANT
BLADE CLIPPER SURG (BLADE) ×1 IMPLANT
BLADE SURG 15 STRL LF DISP TIS (BLADE) ×1 IMPLANT
BLADE SURG 15 STRL SS (BLADE) ×1
CANISTER SUCT 1200ML W/VALVE (MISCELLANEOUS) ×2 IMPLANT
CHLORAPREP W/TINT 26 (MISCELLANEOUS) ×2 IMPLANT
COVER WAND RF STERILE (DRAPES) ×2 IMPLANT
DRAPE 3/4 80X56 (DRAPES) ×2 IMPLANT
DRAPE SURG 17X11 SM STRL (DRAPES) ×4 IMPLANT
DRAPE U-SHAPE 47X51 STRL (DRAPES) ×4 IMPLANT
DRSG OPSITE POSTOP 3X4 (GAUZE/BANDAGES/DRESSINGS) ×4 IMPLANT
DRSG OPSITE POSTOP 4X6 (GAUZE/BANDAGES/DRESSINGS) ×1 IMPLANT
ELECT REM PT RETURN 9FT ADLT (ELECTROSURGICAL) ×2
ELECTRODE REM PT RTRN 9FT ADLT (ELECTROSURGICAL) ×1 IMPLANT
GAUZE XEROFORM 1X8 LF (GAUZE/BANDAGES/DRESSINGS) ×1 IMPLANT
GLOVE BIOGEL PI IND STRL 8 (GLOVE) ×1 IMPLANT
GLOVE BIOGEL PI INDICATOR 8 (GLOVE) ×2
GLOVE SURG SYN 7.5  E (GLOVE) ×2
GLOVE SURG SYN 7.5 E (GLOVE) ×2 IMPLANT
GLOVE SURG SYN 7.5 PF PI (GLOVE) ×1 IMPLANT
GOWN STRL REUS W/ TWL LRG LVL3 (GOWN DISPOSABLE) ×1 IMPLANT
GOWN STRL REUS W/ TWL XL LVL3 (GOWN DISPOSABLE) ×1 IMPLANT
GOWN STRL REUS W/TWL LRG LVL3 (GOWN DISPOSABLE) ×2
GOWN STRL REUS W/TWL XL LVL3 (GOWN DISPOSABLE) ×1
GUIDEROD T2 3X1000 (ROD) ×1 IMPLANT
K-WIRE  3.2X450M STR (WIRE) ×1
K-WIRE 3.2X450M STR (WIRE) ×1
KIT PATIENT CARE HANA TABLE (KITS) ×2 IMPLANT
KIT TURNOVER KIT A (KITS) ×2 IMPLANT
KWIRE 3.2X450M STR (WIRE) IMPLANT
MAT ABSORB  FLUID 56X50 GRAY (MISCELLANEOUS) ×2
MAT ABSORB FLUID 56X50 GRAY (MISCELLANEOUS) ×2 IMPLANT
NAIL KIT TROCH 10X170X125 (Nail) ×1 IMPLANT
NDL FILTER BLUNT 18X1 1/2 (NEEDLE) ×1 IMPLANT
NEEDLE FILTER BLUNT 18X 1/2SAF (NEEDLE) ×1
NEEDLE FILTER BLUNT 18X1 1/2 (NEEDLE) ×1 IMPLANT
NEEDLE HYPO 22GX1.5 SAFETY (NEEDLE) ×2 IMPLANT
NS IRRIG 1000ML POUR BTL (IV SOLUTION) ×2 IMPLANT
PACK HIP COMPR (MISCELLANEOUS) ×2 IMPLANT
PENCIL ELECTRO HAND CTR (MISCELLANEOUS) ×2 IMPLANT
REAMER SHAFT BIXCUT (INSTRUMENTS) ×1 IMPLANT
SCREW LAG GAMMA 3 TI 10.5X100M (Screw) ×1 IMPLANT
SCREW LOCKING T2 F/T  5MMX35MM (Screw) ×1 IMPLANT
SCREW LOCKING T2 F/T 5MMX35MM (Screw) IMPLANT
STAPLER SKIN PROX 35W (STAPLE) ×2 IMPLANT
SUT VIC AB 0 CT1 36 (SUTURE) ×1 IMPLANT
SUT VIC AB 2-0 CT2 27 (SUTURE) ×2 IMPLANT
SYR 10ML LL (SYRINGE) ×2 IMPLANT
SYR 30ML LL (SYRINGE) ×2 IMPLANT
TAPE CLOTH 3X10 WHT NS LF (GAUZE/BANDAGES/DRESSINGS) ×4 IMPLANT

## 2019-06-08 NOTE — Consult Note (Signed)
ORTHOPAEDIC CONSULTATION  REQUESTING PHYSICIAN: Lynn Ito, MD  Chief Complaint:   L hip pain  History of Present Illness: Tanner James is a 63 y.o. male who had a fall while at home yesterday. The patient noted immediate hip pain and inability to ambulate. The patient ambulates unassisted at baseline.  He has been working from home for his job at [employer]. Pain is described as sharp at its worst and a dull ache at its best.  Pain is rated a 10 out of 10 in severity.  Pain is improved with rest and immobilization.  Pain is worse with any sort of movement.  X-rays in the emergency department show a left intertrochanteric hip fracture.  Of note, he does have a history of myasthenia gravis that has been well controlled on medication.  Past Medical History:  Diagnosis Date  . Complication of anesthesia   . Concussion    8/17  . Medical history non-contributory   . Myasthenia gravis, adult form (HCC)   . PONV (postoperative nausea and vomiting)    AFTER COLONOSCOPY   Past Surgical History:  Procedure Laterality Date  . CATARACT EXTRACTION W/PHACO Right 12/28/2016   Procedure: CATARACT EXTRACTION PHACO AND INTRAOCULAR LENS PLACEMENT (IOC);  Surgeon: Lockie Mola, MD;  Location: [facility] ORS;  Service: Ophthalmology;  Laterality: Right;  [place] 00:29.6 AP% 17.5 CDE 5.19 Fluid Pack lot # [id number]  . COLONSCOPY     Social History   Socioeconomic History  . Marital status: Married    Spouse name: Not on file  . Number of children: Not on file  . Years of education: Not on file  . Highest education level: Not on file  Occupational History  . Not on file  Tobacco Use  . Smoking status: Never Smoker  . Smokeless tobacco: Never Used  Substance and Sexual Activity  . Alcohol use: Yes  . Drug use: No  . Sexual activity: Not on file  Other Topics Concern  . Not on file  Social History Narrative  . Not on file   Social  Determinants of Health   Financial Resource Strain:   . Difficulty of Paying Living Expenses: Not on file  Food Insecurity:   . Worried About Programme researcher, broadcasting/film/video in the Last Year: Not on file  . Ran Out of Food in the Last Year: Not on file  Transportation Needs:   . Lack of Transportation (Medical): Not on file  . Lack of Transportation (Non-Medical): Not on file  Physical Activity:   . Days of Exercise per Week: Not on file  . Minutes of Exercise per Session: Not on file  Stress:   . Feeling of Stress : Not on file  Social Connections:   . Frequency of Communication with Friends and Family: Not on file  . Frequency of Social Gatherings with Friends and Family: Not on file  . Attends Religious Services: Not on file  . Active Member of Clubs or Organizations: Not on file  . Attends Banker Meetings: Not on file  . Marital Status: Not on file   History reviewed. No pertinent family history. No Known Allergies Prior to Admission medications   Medication Sig Start Date End Date Taking? Authorizing Provider  mycophenolate (CELLCEPT) 500 MG tablet Take 1,000 mg by mouth 2 (two) times daily. 06/03/19  Yes [provider]  predniSONE (DELTASONE) 10 MG tablet Take 10 mg by mouth daily. 05/27/19  Yes [provider]  pyridostigmine (MESTINON) 60 MG tablet Take  60 mg by mouth 3 (three) times daily. 06/02/19  Yes [provider]   Recent Labs    06/07/19 1554 06/08/19 0442  WBC 9.3 9.4  HGB 13.8 12.9*  HCT 41.8 38.7*  PLT 248 217  K 4.1 3.6  CL 104 106  CO2 26 25  BUN 20 15  CREATININE 1.42* 0.90  GLUCOSE 129* 110*  CALCIUM 8.6* 8.3*  INR 1.0  --    DG Chest 1 View  Result Date: 06/07/2019 CLINICAL DATA:  Mechanical fall today. Left hip pain. Left femur fracture. EXAM: CHEST  1 VIEW COMPARISON:  None. FINDINGS: The heart size and mediastinal contours are within normal limits. Both lungs are clear. The visualized skeletal structures are  unremarkable. IMPRESSION: Normal exam. Electronically Signed   By: Lorriane Shire M.D.   On: 06/07/2019 16:55   DG Hip Unilat W or Wo Pelvis 2-3 Views Left  Result Date: 06/07/2019 CLINICAL DATA:  Can ankle fall this afternoon. Complaining of left hip pain and inability to move the left leg. EXAM: DG HIP (WITH OR WITHOUT PELVIS) 2-3V LEFT COMPARISON:  None. FINDINGS: Comminuted intertrochanteric fracture of the proximal left femur. Primary fracture components nondisplaced and nonangulated. There is a mildly displaced fracture component from the lesser trochanter. No other fractures. Hip joints, SI joints and pubic symphysis are normally aligned. Soft tissues are unremarkable. IMPRESSION: 1. Comminuted intertrochanteric fracture of the proximal left femur with no significant displacement or angulation. Electronically Signed   By: Lajean Manes M.D.   On: 06/07/2019 16:54     Positive ROS: All other systems have been reviewed and were otherwise negative with the exception of those mentioned in the HPI and as above.  Physical Exam: BP 137/81 (BP Location: Left Arm)   Pulse 84   Temp 98.4 F (36.9 C) (Oral)   Resp 17   Ht 5\' 8"  (1.727 m)   Wt 63.5 kg   SpO2 99%   BMI 21.29 kg/m  General:  Alert, no acute distress Psychiatric:  Patient is competent for consent with normal mood and affect   Cardiovascular:  No pedal edema, regular rate and rhythm Respiratory:  No wheezing, non-labored breathing GI:  Abdomen is soft and non-tender Skin:  No lesions in the area of chief complaint, no erythema Neurologic:  Sensation intact distally, CN grossly intact Lymphatic:  No axillary or cervical lymphadenopathy  Orthopedic Exam:  LLE: + DF/PF/EHL SILT grossly over foot Foot wwp +Log roll/axial load   X-rays:  As above: L intertrochanteric hip fracture  Assessment/Plan: Tanner James is a 63 y.o. male with a L intertrochanteric hip fracture   1. I discussed the various treatment options  including both surgical and non-surgical management of her fracture with the patient. We discussed the higher risk of perioperative complications due to patient's age and other co-morbidities. After discussion of risks, benefits, and alternatives to surgery, the family and patient were in agreement to proceed with surgery. The goals of surgery would be to provide adequate pain relief and allow for early mobilization. Plan for surgery is L hip cephalomedullary nailing today, 06/08/2019. 2. NPO until OR 3. Hold anticoagulation in advance of OR     Leim Fabry   06/08/2019 7:57 AM

## 2019-06-08 NOTE — Progress Notes (Signed)
Condom cath removed and in and out cath done to drain bladder.  Patient tolerated procedure well.  450 dark colored urine drained from bladder.

## 2019-06-08 NOTE — Anesthesia Procedure Notes (Signed)
Spinal  Patient location during procedure: OR Start time: 06/08/2019 8:23 AM End time: 06/08/2019 8:27 AM Staffing Resident/CRNA: Clinton Sawyer, CRNA Preanesthetic Checklist Completed: patient identified, IV checked, site marked, risks and benefits discussed, surgical consent, monitors and equipment checked, pre-op evaluation and timeout performed Spinal Block Patient position: sitting Prep: ChloraPrep Patient monitoring: heart rate, continuous pulse ox, blood pressure and cardiac monitor Approach: midline Location: L3-4 Injection technique: single-shot Needle Needle type: Introducer and Pencil-Tip  Needle gauge: 24 G Needle length: 9 cm Assessment Sensory level: T10 Additional Notes Negative paresthesia. Negative blood return. Positive free-flowing CSF. Expiration date of kit checked and confirmed. Patient tolerated procedure well, without complications.

## 2019-06-08 NOTE — Plan of Care (Signed)

## 2019-06-08 NOTE — H&P (Signed)
H&P reviewed. No significant changes noted.  

## 2019-06-08 NOTE — Op Note (Signed)
DATE OF SURGERY: 06/08/2019  PREOPERATIVE DIAGNOSIS: Left intertrochanteric hip fracture  POSTOPERATIVE DIAGNOSIS: Left intertrochanteric hip fracture  PROCEDURE: Intramedullary nailing of Left femur with cephalomedullary device  SURGEON: Rosealee Albee, MD  ASSISTANTS: none  EBL: 100 cc  COMPONENTS:  [employer] Short Gamma Nail: 10x155mm; lag screw; 68mm distal interlocking screw   INDICATIONS: Tanner James is a 63 y.o. male who sustained an intertrochanteric fracture after a fall. Risks and benefits of intramedullary nailing were explained to the patient. Risks include but are not limited to bleeding, infection, injury to tissues, nerves, vessels, periprosthetic infection, DVT/PE, malunion/nonunion, and risks of anesthesia. The patient understands these risks, has completed an informed consent, and wishes to proceed.   PROCEDURE:  The patient was brought into the operating room. After administering anesthesia, the patient was placed in the supine position on the Hana table. The uninjured leg was extended while the injured lower extremity was placed in longitudinal traction. The fracture was reduced using longitudinal traction and internal rotation. The adequacy of reduction was verified fluoroscopically in AP and lateral projections. The lateral aspects of the operative hip and thigh were prepped with ChloraPrep solution before being draped sterilely. Preoperative IV antibiotics were administered. A timeout was performed to verify the appropriate surgical site, patient, and procedure.   The greater trochanter was identified and an approximately 6cm incision was made about 3 fingerbreadths above the tip of the greater trochanter. The incision was carried down through the subcutaneous tissues to expose the gluteal fascia. This was split the length of the incision, providing access to the tip of the trochanter. Under fluoroscopic guidance, a guidewire was drilled through the tip of the  trochanter into the proximal metaphysis to the level of the lesser trochanter. After verifying its position fluoroscopically in AP and lateral projections, it was overreamed with the opening reamer to the level of the lesser trochanter. A guidewire was passed down through the femoral canal. The adequacy of guidewire position was verified fluoroscopically in AP and lateral projections. The guidewire was overreamed sequentially using the flexible reamers. The [employer] Short Gamma Nail was advanced to the appropriate depth as verified fluoroscopically.    The guide system for the lag screw was positioned and advanced through an approximately 4 cm stab incision over the lateral aspect of the proximal femur. The guidewire was drilled up through the femoral nail and into the femoral neck to rest within 5 mm of subchondral bone. After verifying its position in the femoral neck and head in both AP and lateral projections, the guidewire was measured and was overreamed to the appropriate depth before the lag screw was inserted and advanced to the appropriate depth as verified fluoroscopically in AP and lateral projections. The set screw was tightened and then untightened a quarter turn to allow for compression. Again, the adequacy of hardware position and fracture reduction was verified fluoroscopically in AP and lateral projections and found to be excellent.   Attention was directed distally and a 3cm incision was made for the distal interlocking screw. The aiming arm was used to drill and an appropriately sized screw was selected and advanced. There was excellent purchase of the screw. Adequacy of screw position was verified fluoroscopically in AP and lateral projections and found to be excellent.   The wounds were irrigated thoroughly with sterile saline solution. Local anesthetic was injected into the wounds. Deep fascia was closed with 0 Vicryl.  The subcutaneous tissues were closed using 2-0 Vicryl interrupted  sutures. The skin  was closed using staples. Sterile occlusive dressings were applied to all wounds. The patient was then transferred to the recovery room in satisfactory condition after tolerating the procedure well.  POSTOPERATIVE PLAN: The patient will be WBAT on the operative extremity. Lovenox 40mg /day x 4 weeks to start on POD#1. Ancef x 24 hours. PT/OT on POD#1.

## 2019-06-08 NOTE — Transfer of Care (Signed)
Immediate Anesthesia Transfer of Care Note  Patient: Tanner James  Procedure(s) Performed: INTRAMEDULLARY (IM) NAIL INTERTROCHANTRIC (Left )  Patient Location: PACU  Anesthesia Type:Spinal  Level of Consciousness: awake, alert  and oriented  Airway & Oxygen Therapy: Patient Spontanous Breathing and Patient connected to nasal cannula oxygen  Post-op Assessment: Report given to RN and Post -op Vital signs reviewed and stable  Post vital signs: Reviewed and stable  Last Vitals:  Vitals Value Taken Time  BP 115/84 06/08/19 0946  Temp 36.4 C 06/08/19 0946  Pulse 78 06/08/19 0947  Resp 12 06/08/19 0947  SpO2 98 % 06/08/19 0947  Vitals shown include unvalidated device data.  Last Pain:  Vitals:   06/08/19 0741  TempSrc:   PainSc: 8       Patients Stated Pain Goal: 2 (54/27/06 2376)  Complications: No apparent anesthesia complications

## 2019-06-08 NOTE — Anesthesia Post-op Follow-up Note (Signed)
Anesthesia QCDR form completed.        

## 2019-06-08 NOTE — Anesthesia Preprocedure Evaluation (Addendum)
Anesthesia Evaluation  Patient identified by MRN, date of birth, ID band Patient awake    Reviewed: Allergy & Precautions, H&P , NPO status , reviewed documented beta blocker date and time   History of Anesthesia Complications (+) PONV and history of anesthetic complications  Airway Mallampati: II  TM Distance: >3 FB Neck ROM: full    Dental  (+) Teeth Intact, Chipped   Pulmonary    Pulmonary exam normal        Cardiovascular Normal cardiovascular exam  2009 ECHO, Low Nrml EF, no sig valvular findings   Neuro/Psych  Neuromuscular disease    GI/Hepatic   Endo/Other    Renal/GU Renal disease     Musculoskeletal   Abdominal   Peds  Hematology   Anesthesia Other Findings Past Medical History: No date: Complication of anesthesia No date: Concussion     Comment:  8/17 No date: Medical history non-contributory No date: Myasthenia gravis, adult form ([place]) No date: PONV (postoperative nausea and vomiting)     Comment:  AFTER COLONOSCOPY  Past Surgical History: 12/28/2016: CATARACT EXTRACTION W/PHACO; Right     Comment:  Procedure: CATARACT EXTRACTION PHACO AND INTRAOCULAR               LENS PLACEMENT ([place]);  Surgeon: Leandrew Koyanagi, MD;              Location: [facility] ORS;  Service: Ophthalmology;  Laterality:              Right;  [place] 00:29.6 AP% 17.5 CDE 5.19 Fluid Pack lot #               [id number] No date: COLONSCOPY  BMI    Body Mass Index: 21.29 kg/m      Reproductive/Obstetrics                            Anesthesia Physical Anesthesia Plan  ASA: III and emergent  Anesthesia Plan: Spinal and MAC   Post-op Pain Management:    Induction: Intravenous  PONV Risk Score and Plan: TIVA, Midazolam, Ondansetron and Treatment may vary due to age or medical condition  Airway Management Planned: Nasal Cannula and Natural Airway  Additional Equipment:   Intra-op Plan:    Post-operative Plan:   Informed Consent: I have reviewed the patients History and Physical, chart, labs and discussed the procedure including the risks, benefits and alternatives for the proposed anesthesia with the patient or authorized representative who has indicated his/her understanding and acceptance.     Dental Advisory Given  Plan Discussed with: CRNA  Anesthesia Plan Comments:        Anesthesia Quick Evaluation

## 2019-06-08 NOTE — Progress Notes (Signed)
Bladder scan done in pacu, 604 ml present.

## 2019-06-08 NOTE — Progress Notes (Signed)
PROGRESS NOTE    Tanner James  GMW:[id number] DOB: 23-Jun-1956 DOA: 06/07/2019 PCP: [facility]    Brief Narrative:  Tanner James is a 63 y.o. 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  Denies shortness of breath chest pain or any other complaints.  Objective: Vitals:   06/08/19 1031 06/08/19 1046 06/08/19 1107 06/08/19 1141  BP: 126/79 122/74 121/76 111/69  Pulse: 84 81 84 83  Resp: 16 (!) 21 18 16   Temp:  98.7 F (37.1 C) 98.6 F (37 C) (!) 97.5 F (36.4 C)  TempSrc:   Oral Oral  SpO2: 98% 98% 98% 97%  Weight:      Height:        Intake/Output Summary (Last 24 hours) at 06/08/2019 1454 Last data filed at 06/08/2019 1051 Gross per 24 hour  Intake 500 ml  Output 590 ml  Net -90 ml   Filed Weights   06/07/19 1534  Weight: 63.5 kg    Examination:  General exam: Appears calm and comfortable  Respiratory system: Clear to auscultation. Respiratory effort normal. Cardiovascular system: S1 & S2 heard, RRR. No JVD, murmurs, rubs, gallops or clicks. No pedal edema. Gastrointestinal system: Abdomen is nondistended, soft and nontender. Normal bowel sounds heard. Central nervous system: Alert and oriented. No focal neurological deficits. Extremities: No cyanosis Psychiatry: Judgement and insight appear normal. Mood & affect appropriate.     Data Reviewed: I have personally reviewed following labs and imaging studies  CBC: Recent Labs  Lab 06/07/19 1554 06/08/19 0442  WBC 9.3 9.4  NEUTROABS 8.4*  --   HGB 13.8 12.9*  HCT 41.8 38.7*  MCV  97.9 98.5  PLT 248 217   Basic Metabolic Panel: Recent Labs  Lab 06/07/19 1554 06/08/19 0442  NA 139 138  K 4.1 3.6  CL 104 106  CO2 26 25  GLUCOSE 129* 110*  BUN 20 15  CREATININE 1.42* 0.90  CALCIUM 8.6* 8.3*   GFR: Estimated Creatinine Clearance: 76.4 mL/min (by C-G formula based on SCr of 0.9 mg/dL). Liver Function Tests: No results for input(s): AST, ALT, ALKPHOS, BILITOT, PROT, ALBUMIN in the last 168 hours. No results for input(s): LIPASE, AMYLASE in the last 168 hours. No results for input(s): AMMONIA in the last 168 hours. Coagulation Profile: Recent Labs  Lab 06/07/19 1554  INR 1.0   Cardiac Enzymes: No results for input(s): CKTOTAL, CKMB, CKMBINDEX, TROPONINI in the last 168 hours. BNP (last 3 results) No results for input(s): PROBNP in the last 8760 hours. HbA1C: No results for input(s): HGBA1C in the last 72 hours. CBG: No results for input(s): GLUCAP in the last 168 hours. Lipid Profile: No results for input(s): CHOL, HDL, LDLCALC, TRIG, CHOLHDL, LDLDIRECT in the last 72 hours. Thyroid Function Tests: No results for input(s): TSH, T4TOTAL, FREET4, T3FREE, THYROIDAB in the last 72 hours. Anemia Panel: No results for input(s): VITAMINB12, FOLATE, FERRITIN, TIBC, IRON, RETICCTPCT in the last 72 hours. Sepsis Labs: No results for input(s): PROCALCITON, LATICACIDVEN in the last 168 hours.  Recent Results (from the past 240 hour(s))  Respiratory Panel by RT PCR (Flu A&B, Covid) - Nasopharyngeal Swab  Status: None   Collection Time: 06/07/19  6:43 PM   Specimen: Nasopharyngeal Swab  Result Value Ref Range Status   SARS Coronavirus 2 by RT PCR NEGATIVE NEGATIVE Final    Comment: (NOTE) SARS-CoV-2 target nucleic acids are NOT DETECTED. The SARS-CoV-2 RNA is generally detectable in upper respiratoy specimens during the acute phase of infection. The lowest concentration of SARS-CoV-2 viral copies this assay can detect is 131 copies/mL. A negative result  does not preclude SARS-Cov-2 infection and should not be used as the sole basis for treatment or other patient management decisions. A negative result may occur with  improper specimen collection/handling, submission of specimen other than nasopharyngeal swab, presence of viral mutation(s) within the areas targeted by this assay, and inadequate number of viral copies (<131 copies/mL). A negative result must be combined with clinical observations, patient history, and epidemiological information. The expected result is Negative. Fact Sheet for Patients:  https://www.moore.com/https://www.fda.gov/media/142436/download Fact Sheet for Healthcare Providers:  https://www.young.biz/https://www.fda.gov/media/142435/download This test is not yet ap proved or cleared by the [place] FDA and  has been authorized for detection and/or diagnosis of SARS-CoV-2 by FDA under an Emergency Use Authorization (EUA). This EUA will remain  in effect (meaning this test can be used) for the duration of the COVID-19 declaration under Section 564(b)(1) of the Act, 21 U.S.C. section 360bbb-3(b)(1), unless the authorization is terminated or revoked sooner.    Influenza A by PCR NEGATIVE NEGATIVE Final   Influenza B by PCR NEGATIVE NEGATIVE Final    Comment: (NOTE) The Xpert Xpress SARS-CoV-2/FLU/RSV assay is intended as an aid in  the diagnosis of influenza from Nasopharyngeal swab specimens and  should not be used as a sole basis for treatment. Nasal washings and  aspirates are unacceptable for Xpert Xpress SARS-CoV-2/FLU/RSV  testing. Fact Sheet for Patients: https://www.moore.com/https://www.fda.gov/media/142436/download Fact Sheet for Healthcare Providers: https://www.young.biz/https://www.fda.gov/media/142435/download This test is not yet approved or cleared by the [place] FDA and  has been authorized for detection and/or diagnosis of SARS-CoV-2 by  FDA under an Emergency Use Authorization (EUA). This EUA will remain  in effect (meaning this test can be used) for the duration of  the  Covid-19 declaration under Section 564(b)(1) of the Act, 21  U.S.C. section 360bbb-3(b)(1), unless the authorization is  terminated or revoked. Performed at [facility], [address] [place], [place] [address]   Surgical pcr screen     Status: None   Collection Time: 06/08/19 12:03 AM   Specimen: Nasal Mucosa; Nasal Swab  Result Value Ref Range Status   MRSA, PCR NEGATIVE NEGATIVE Final   Staphylococcus aureus NEGATIVE NEGATIVE Final    Comment: (NOTE) The Xpert SA Assay (FDA approved for NASAL specimens in patients 62 years of age and older), is one component of a comprehensive surveillance program. It is not intended to diagnose infection nor to guide or monitor treatment. Performed at [facility], [address] [place], [place] [address]          Radiology Studies: DG Chest 1 View  Result Date: 06/07/2019 CLINICAL DATA:  Mechanical fall today. Left hip pain. Left femur fracture. EXAM: CHEST  1 VIEW COMPARISON:  None. FINDINGS: The heart size and mediastinal contours are within normal limits. Both lungs are clear. The visualized skeletal structures are unremarkable. IMPRESSION: Normal exam. Electronically Signed   By: Francene BoyersJames  Maxwell M.D.   On: 06/07/2019 16:55   DG HIP OPERATIVE UNILAT W OR W/O PELVIS LEFT  Result Date: 06/08/2019 CLINICAL DATA:  63 year old  male status post left femur intertrochanteric fracture. EXAM: OPERATIVE LEFT HIP (WITH PELVIS IF PERFORMED) 7 VIEWS TECHNIQUE: Fluoroscopic spot image(s) were submitted for interpretation post-operatively. COMPARISON:  Left hip series 06/07/2019. FINDINGS: 7 intraoperative fluoroscopic spot views of the left femur demonstrate ORIF with intramedullary rod, proximal interlocking dynamic hip screw, and distal interlocking cortical screw. No adverse hardware features. Near anatomic alignment. FLUOROSCOPY TIME:  0 minutes 54 seconds IMPRESSION: ORIF left femur with no adverse features.  Electronically Signed   By: Genevie Ann M.D.   On: 06/08/2019 12:36   DG Hip Unilat W or Wo Pelvis 2-3 Views Left  Result Date: 06/07/2019 CLINICAL DATA:  Can ankle fall this afternoon. Complaining of left hip pain and inability to move the left leg. EXAM: DG HIP (WITH OR WITHOUT PELVIS) 2-3V LEFT COMPARISON:  None. FINDINGS: Comminuted intertrochanteric fracture of the proximal left femur. Primary fracture components nondisplaced and nonangulated. There is a mildly displaced fracture component from the lesser trochanter. No other fractures. Hip joints, SI joints and pubic symphysis are normally aligned. Soft tissues are unremarkable. IMPRESSION: 1. Comminuted intertrochanteric fracture of the proximal left femur with no significant displacement or angulation. Electronically Signed   By: Lajean Manes M.D.   On: 06/07/2019 16:54        Scheduled Meds: . acetaminophen  1,000 mg Oral Q8H  . docusate sodium  100 mg Oral BID  . [START ON 06/09/2019] enoxaparin (LOVENOX) injection  40 mg Subcutaneous Q24H  . mycophenolate  1,000 mg Oral BID  . predniSONE  10 mg Oral Daily  . pyridostigmine  60 mg Oral TID   Continuous Infusions: . sodium chloride 75 mL/hr at 06/08/19 1324  .  ceFAZolin (ANCEF) IV 1 g (06/08/19 1325)  . methocarbamol (ROBAXIN) IV      Assessment & Plan:   Principal Problem:   Hip fracture ([place]) Active Problems:   AKI (acute kidney injury) ([place])   Myasthenia gravis ([place])   Comminuted intertrochanteric fracture of the proximal left femur Status post Intramedullary nailing of Left femur with cephalomedullary device 06/08/19 -Transition team consulted for rehab placement Pain control PT OT  AKI -Creatinine of 1.4 on admission -Prerenal, improved with IV hydration at baseline  Myasthenia gravis affecting bulbar muscles -Speech eval -Continue rivastigmine, cellcept and prednisone   DVT prophylaxis: Lovenox  code Status: Full Family Communication:  None Disposition Plan: When deemed medically safe from orthopedic standpoint.  PT OT       LOS: 1 day   Time spent: 45 minutes with more than 50% on [place], MD [facility] Pager [phone]  If 7PM-7AM, please contact night-coverage [email or web address] Password [email or web address] 06/08/2019, 2:54 PM

## 2019-06-08 NOTE — Progress Notes (Signed)
15 minute call to floor. 

## 2019-06-09 DIAGNOSIS — S72002D Fracture of unspecified part of neck of left femur, subsequent encounter for closed fracture with routine healing: Secondary | ICD-10-CM

## 2019-06-09 LAB — CBC
HCT: 35.6 % — ABNORMAL LOW (ref 39.0–52.0)
Hemoglobin: 12.6 g/dL — ABNORMAL LOW (ref 13.0–17.0)
MCH: 33 pg (ref 26.0–34.0)
MCHC: 35.4 g/dL (ref 30.0–36.0)
MCV: 93.2 fL (ref 80.0–100.0)
Platelets: 197 10*3/uL (ref 150–400)
RBC: 3.82 MIL/uL — ABNORMAL LOW (ref 4.22–5.81)
RDW: 12.7 % (ref 11.5–15.5)
WBC: 9.4 10*3/uL (ref 4.0–10.5)
nRBC: 0 % (ref 0.0–0.2)

## 2019-06-09 LAB — BASIC METABOLIC PANEL
Anion gap: 10 (ref 5–15)
BUN: 8 mg/dL (ref 8–23)
CO2: 24 mmol/L (ref 22–32)
Calcium: 8.1 mg/dL — ABNORMAL LOW (ref 8.9–10.3)
Chloride: 102 mmol/L (ref 98–111)
Creatinine, Ser: 0.69 mg/dL (ref 0.61–1.24)
GFR calc Af Amer: >60 mL/min (ref >60)
GFR calc non Af Amer: >60 mL/min (ref >60)
Glucose, Bld: 112 mg/dL — ABNORMAL HIGH (ref 70–99)
Potassium: 3.6 mmol/L (ref 3.5–5.1)
Sodium: 136 mmol/L (ref 135–145)

## 2019-06-09 NOTE — Evaluation (Signed)
Occupational Therapy Evaluation Patient Details Name: Tanner James MRN: [id number] DOB: 1956/02/23 Today's Date: 06/09/2019    History of Present Illness Patient is 63 yo male s/p L hip nailing due to fall at home. PMH of myasthenia gravis   Clinical Impression   Pt seen for OT evaluation this date, POD#1 from above surgery. 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 Pt is eager to return to PLOF with less pain and improved safety and independence. Pt currently requires Max A from bed level for LB ADL mgt 2/2 significant L hip pain with any mobility attempts. Max A to reposition in bed. Pt instructed in strategies to reposition for improved comfort and pressure relief. Pt also instructed in AE/DME for ADL. Pt verbalized understanding and would  benefit from additional instruction in self care skills and techniques to help maintain precautions with or without assistive devices to support recall and carryover prior to discharge. Recommend SNF upon discharge.     Follow Up Recommendations  SNF    Equipment Recommendations  3 in 1 bedside commode;Other (comment)(reacher, sock aide, LH sponge)    Recommendations for Other Services       Precautions / Restrictions Precautions Precautions: Fall Precaution Comments: IM nailing Restrictions Weight Bearing Restrictions: Yes LLE Weight Bearing: Weight bearing as tolerated      Mobility Bed Mobility Overal bed mobility: Needs Assistance Bed Mobility: Supine to Sit;Sit to Supine     Supine to sit: Mod assist Sit to supine: Mod assist;+2 for physical assistance   General bed mobility comments: pt deferred 2/2 pain  Transfers                 General transfer comment: pt deferred 2/2 pain    Balance Overall balance assessment: Needs assistance Sitting-balance support: Feet supported Sitting balance-Leahy Scale: Poor Sitting balance - Comments:  Pt requires at least minA to maintain balance                                   ADL either performed or assessed with clinical judgement   ADL Overall ADL's : Needs assistance/impaired                                       General ADL Comments: Max A for LB ADL, very pain limited     Vision Baseline Vision/History: Wears glasses Wears Glasses: Reading only Patient Visual Report: No change from baseline       Perception     Praxis      Pertinent Vitals/Pain Pain Assessment: 0-10 Pain Score: 8  Pain Location: L hip pain with bed mobility, otherwise was a 5/10 at rest Pain Descriptors / Indicators: Aching;Grimacing Pain Intervention(s): Limited activity within patient's tolerance;Monitored during session;Repositioned     Hand Dominance Right   Extremity/Trunk Assessment Upper Extremity Assessment Upper Extremity Assessment: Generalized weakness   Lower Extremity Assessment Lower Extremity Assessment: Generalized weakness   Cervical / Trunk Assessment Cervical / Trunk Assessment: Normal   Communication Communication Communication: No difficulties   Cognition Arousal/Alertness: Awake/alert Behavior During Therapy: WFL for tasks assessed/performed Overall Cognitive Status: Within Functional Limits for tasks assessed  General Comments       Exercises  Other Exercises Other Exercises: Pt instructed in use of AE/DME for ADL to improve independence /safety; pt verbalized understanding but would benefit from additional instruction to support recall and carryover Other Exercises: Pt instructed in bed mobility to improve comfort/minimize pain and improve independence with repositioning as needed for pressure relief   Shoulder Instructions      Home Living Family/patient expects to be discharged to:: Private residence Living Arrangements: Spouse/significant other Available Help at  Discharge: Available 24 hours/day Type of Home: House Home Access: Stairs to enter [employer] of Steps: 4 Entrance Stairs-Rails: Right Home Layout: Two level;Able to live on main level with bedroom/bathroom     Bathroom Shower/Tub: Teacher, early years/pre: Standard     Home Equipment: Grab bars - tub/shower   Additional Comments: Pt has equipment for his wife, he does not have any AD.      Prior Functioning/Environment Level of Independence: Independent                 OT Problem List: Decreased strength;Pain;Decreased knowledge of use of DME or AE;Impaired balance (sitting and/or standing)      OT Treatment/Interventions: Self-care/ADL training;Therapeutic exercise;Therapeutic activities;DME and/or AE instruction;Patient/family education;Balance training    OT Goals(Current goals can be found in the care plan section) Acute Rehab OT Goals Patient Stated Goal: to decrease pain OT Goal Formulation: With patient Time For Goal Achievement: 06/23/19 Potential to Achieve Goals: Good ADL Goals Pt Will Perform Lower Body Dressing: with mod assist;sitting/lateral leans;with adaptive equipment Pt Will Transfer to Toilet: with mod assist;bedside commode;stand pivot transfer Additional ADL Goal #1: Pt will perform bed mobility with Mod A x1 in preparation for seated ADL tasks.  OT Frequency: Min 1X/week   Barriers to D/C:            Co-evaluation              AM-PAC OT "6 Clicks" Daily Activity     Outcome Measure Help from another person eating meals?: None Help from another person taking care of personal grooming?: None Help from another person toileting, which includes using toliet, bedpan, or urinal?: Total Help from another person bathing (including washing, rinsing, drying)?: A Lot Help from another person to put on and taking off regular upper body clothing?: A Little Help from another person to put on and taking off regular lower body  clothing?: A Lot 6 Click Score: 16   End of Session    Activity Tolerance: Patient limited by pain Patient left: in bed;with call bell/phone within reach;with bed alarm set  OT Visit Diagnosis: Other abnormalities of gait and mobility (R26.89);Muscle weakness (generalized) (M62.81);Pain Pain - Right/Left: Left Pain - part of body: Hip                Time: 4332-9518 OT Time Calculation (min): 18 min Charges:  OT General Charges $OT Visit: 1 Visit OT Evaluation $OT Eval Moderate Complexity: 1 Mod OT Treatments $Self Care/Home Management : 8-22 mins  Jeni Salles, MPH, MS, OTR/L ascom [phone] 06/09/19, 12:09 PM

## 2019-06-09 NOTE — Discharge Instructions (Signed)
INSTRUCTIONS AFTER Surgery  o Remove items at home which could result in a fall. This includes throw rugs or furniture in walking pathways o ICE to the affected joint every three hours while awake for 30 minutes at a time, for at least the first 3-5 days, and then as needed for pain and swelling.  Continue to use ice for pain and swelling. You may notice swelling that will progress down to the foot and ankle.  This is normal after surgery.  Elevate your leg when you are not up walking on it.   o Continue to use the breathing machine you got in the hospital (incentive spirometer) which will help keep your temperature down.  It is common for your temperature to cycle up and down following surgery, especially at night when you are not up moving around and exerting yourself.  The breathing machine keeps your lungs expanded and your temperature down.   DIET:  As you were doing prior to hospitalization, we recommend a well-balanced diet.  DRESSING / WOUND CARE / SHOWERING  Dressing change as needed.  No showering.  Staples will be removed in 2 weeks at [facility] clinic.  ACTIVITY  o Increase activity slowly as tolerated, but follow the weight bearing instructions below.   o No driving for 6 weeks or until further direction given by your physician.  You cannot drive while taking narcotics.  o No lifting or carrying greater than 10 lbs. until further directed by your surgeon. o Avoid periods of inactivity such as sitting longer than an hour when not asleep. This helps prevent blood clots.  o You may return to work once you are authorized by your doctor.     WEIGHT BEARING  Weightbearing as tolerated on the left   EXERCISES Gait training and ambulation with physical therapy.  Strengthening.  CONSTIPATION  Constipation is defined medically as fewer than three stools per week and severe constipation as less than one stool per week.  Even if you have a regular bowel pattern at home, your normal  regimen is likely to be disrupted due to multiple reasons following surgery.  Combination of anesthesia, postoperative narcotics, change in appetite and fluid intake all can affect your bowels.   YOU MUST use at least one of the following options; they are listed in order of increasing strength to get the job done.  They are all available over the counter, and you may need to use some, POSSIBLY even all of these options:    Drink plenty of fluids (prune juice may be helpful) and high fiber foods Colace 100 mg by mouth twice a day  Senokot for constipation as directed and as needed Dulcolax (bisacodyl), take with full glass of water  Miralax (polyethylene glycol) once or twice a day as needed.  If you have tried all these things and are unable to have a bowel movement in the first 3-4 days after surgery call either your surgeon or your primary doctor.    If you experience loose stools or diarrhea, hold the medications until you stool forms back up.  If your symptoms do not get better within 1 week or if they get worse, check with your doctor.  If you experience "the worst abdominal pain ever" or develop nausea or vomiting, please contact the office immediately for further recommendations for treatment.   ITCHING:  If you experience itching with your medications, try taking only a single pain pill, or even half a pain pill at a time.  You can also use Benadryl over the counter for itching or also to help with sleep.   TED HOSE STOCKINGS:  Use stockings on both legs until for at least 2 weeks or as directed by physician office. They may be removed at night for sleeping.  MEDICATIONS:  See your medication summary on the "After Visit Summary" that nursing will review with you.  You may have some home medications which will be placed on hold until you complete the course of blood thinner medication.  It is important for you to complete the blood thinner medication as prescribed.  PRECAUTIONS:  If you  experience chest pain or shortness of breath - call 911 immediately for transfer to the hospital emergency department.   If you develop a fever greater that 101 F, purulent drainage from wound, increased redness or drainage from wound, foul odor from the wound/dressing, or calf pain - CONTACT YOUR SURGEON.                                                   FOLLOW-UP APPOINTMENTS:  If you do not already have a post-op appointment, please call the office for an appointment to be seen by your surgeon.  Guidelines for how soon to be seen are listed in your "After Visit Summary", but are typically between 1-4 weeks after surgery.  OTHER INSTRUCTIONS:     MAKE SURE YOU:  . Understand these instructions.  . Get help right away if you are not doing well or get worse.    Thank you for letting us be a part of your medical care team.  It is a privilege we respect greatly.  We hope these instructions will help you stay on track for a fast and full recovery!

## 2019-06-09 NOTE — Progress Notes (Signed)
  Subjective: 1 Day Post-Op Procedure(s) (LRB): INTRAMEDULLARY (IM) NAIL INTERTROCHANTRIC (Left) Patient reports pain as moderate.   Patient is well, and has had no acute complaints or problems Plan is to go home versus Rehab after hospital stay. Negative for chest pain and shortness of breath Fever: no Gastrointestinal: Negative for nausea and vomiting  Objective: Vital signs in last 24 hours: Temp:  [97.5 F (36.4 C)-98.7 F (37.1 C)] 97.8 F (36.6 C) (12/13 2247) Pulse Rate:  [81-103] 103 (12/13 2247) Resp:  [12-21] 18 (12/13 2247) BP: (111-163)/(69-100) 163/85 (12/13 2247) SpO2:  [97 %-100 %] 97 % (12/13 2247)  Intake/Output from previous day:  Intake/Output Summary (Last 24 hours) at 06/09/2019 0643 Last data filed at 06/09/2019 0500 Gross per 24 hour  Intake 827.27 ml  Output 1090 ml  Net -262.73 ml    Intake/Output this shift: Total I/O In: 460 [P.O.:360; IV Piggyback:100] Out: 500 [Urine:500]  Labs: Recent Labs    06/07/19 1554 06/08/19 0442 06/09/19 0428  HGB 13.8 12.9* 12.6*   Recent Labs    06/08/19 0442 06/09/19 0428  WBC 9.4 9.4  RBC 3.93* 3.82*  HCT 38.7* 35.6*  PLT 217 197   Recent Labs    06/08/19 0442 06/09/19 0428  NA 138 136  K 3.6 3.6  CL 106 102  CO2 25 24  BUN 15 8  CREATININE 0.90 0.69  GLUCOSE 110* 112*  CALCIUM 8.3* 8.1*   Recent Labs    06/07/19 1554  INR 1.0     EXAM General - Patient is Alert and Oriented Extremity - Neurovascular intact Sensation intact distally Dorsiflexion/Plantar flexion intact Compartment soft Dressing/Incision - clean, dry, no drainage Motor Function - intact, moving foot and toes well on exam.   Past Medical History:  Diagnosis Date  . Complication of anesthesia   . Concussion    8/17  . Medical history non-contributory   . Myasthenia gravis, adult form ([place])   . PONV (postoperative nausea and vomiting)    AFTER COLONOSCOPY    Assessment/Plan: 1 Day Post-Op Procedure(s)  (LRB): INTRAMEDULLARY (IM) NAIL INTERTROCHANTRIC (Left) Principal Problem:   Hip fracture ([facility]) Active Problems:   AKI (acute kidney injury) ([place])   Myasthenia gravis ([place])   Closed nondisplaced intertrochanteric fracture of left femur ([facility])  Estimated body mass index is 21.29 kg/m as calculated from the following:   Height as of this encounter: 5\' 8"  (1.727 m).   Weight as of this encounter: 63.5 kg. Advance diet Up with therapy D/C IV fluids  Discharge planning with care management  DVT Prophylaxis - Lovenox, Foot Pumps and TED hose Weight-Bearing as tolerated to left leg  Reche Dixon, PA-C Orthopaedic Surgery 06/09/2019, 6:43 AM

## 2019-06-09 NOTE — Evaluation (Signed)
Physical Therapy Evaluation Patient Details Name: Tanner James MRN: [id number] DOB: [birth year] Today's Date: 06/09/2019   History of Present Illness  Patient is 63 yo male s/p L hip nailing due to fall at home. PMH of myasthenia gravis    Clinical Impression  The patient was alert, reported pain throughout session, increased from 5/10 to 8/10 with mobility. Per patient prior to admission he was independent at baseline, with only this fall in the last 6 months.   The patient needed significant tactile/verbal cues throughout session to maximize participation and mobility. Therapeutic exercises limited due to muscle guarding. The patient needed modA to mobilize to EOB and at least a minA to maintain seated balance for several minutes. modAx2 to return to supine.  Overall the patient demonstrated deficits (see "PT Problem List") that impede the patient's functional abilities, safety, and mobility and would benefit from skilled PT intervention. Recommendation is STR due to acute decline in functional status and current level of assistance needed.     Follow Up Recommendations SNF    Equipment Recommendations  Rolling walker with 5" wheels    Recommendations for Other Services       Precautions / Restrictions Precautions Precautions: Fall Precaution Comments: IM nailing Restrictions Weight Bearing Restrictions: Yes LLE Weight Bearing: Weight bearing as tolerated      Mobility  Bed Mobility Overal bed mobility: Needs Assistance Bed Mobility: Supine to Sit;Sit to Supine     Supine to sit: Mod assist Sit to supine: Mod assist;+2 for physical assistance      Transfers                    Ambulation/Gait                Stairs            Wheelchair Mobility    Modified Rankin (Stroke Patients Only)       Balance Overall balance assessment: Needs assistance Sitting-balance support: Feet supported Sitting balance-Leahy Scale: Poor Sitting balance -  Comments: Pt requires at least minA to maintain balance                                     Pertinent Vitals/Pain Pain Assessment: 0-10 Pain Score: 8  Pain Location: L hip pain Pain Descriptors / Indicators: Aching;Grimacing Pain Intervention(s): Limited activity within patient's tolerance;Monitored during session;Repositioned;Patient requesting pain meds-RN notified    Home Living Family/patient expects to be discharged to:: Private residence Living Arrangements: Spouse/significant other Available Help at Discharge: Available 24 hours/day Type of Home: House Home Access: Stairs to enter Entrance Stairs-Rails: Right Entrance Stairs-Number of Steps: 4 Home Layout: Two level;Able to live on main level with bedroom/bathroom Home Equipment: Grab bars - tub/shower Additional Comments: Pt has equipment for his wife, he does not have any AD.    Prior Function Level of Independence: Independent               Hand Dominance   Dominant Hand: Right    Extremity/Trunk Assessment   Upper Extremity Assessment Upper Extremity Assessment: Generalized weakness    Lower Extremity Assessment Lower Extremity Assessment: Generalized weakness    Cervical / Trunk Assessment Cervical / Trunk Assessment: Normal  Communication   Communication: No difficulties  Cognition Arousal/Alertness: Awake/alert Behavior During Therapy: WFL for tasks assessed/performed Overall Cognitive Status: Within Functional Limits for tasks assessed  General Comments      Exercises General Exercises - Lower Extremity Ankle Circles/Pumps: AROM;Both;10 reps;Strengthening Quad Sets: AROM;Strengthening;Both;10 reps Gluteal Sets: AROM;Strengthening;Both;10 reps Short Arc Quad: AROM;Strengthening;Left;10 reps Heel Slides: AAROM;Left;10 reps Hip ABduction/ADduction: AAROM;Left;10 reps   Assessment/Plan    PT Assessment Patient needs  continued PT services  PT Problem List Decreased mobility;Decreased strength;Decreased range of motion;Decreased knowledge of precautions;Decreased activity tolerance;Decreased balance;Decreased knowledge of use of DME;Pain       PT Treatment Interventions DME instruction;Therapeutic exercise;Gait training;Balance training;Stair training;Neuromuscular re-education;Functional mobility training;Therapeutic activities;Patient/family education    PT Goals (Current goals can be found in the Care Plan section)  Acute Rehab PT Goals Patient Stated Goal: to decrease pain PT Goal Formulation: With patient Time For Goal Achievement: 06/23/19 Potential to Achieve Goals: Good    Frequency BID   Barriers to discharge        Co-evaluation               AM-PAC PT "6 Clicks" Mobility  Outcome Measure Help needed turning from your back to your side while in a flat bed without using bedrails?: A Lot Help needed moving from lying on your back to sitting on the side of a flat bed without using bedrails?: A Lot Help needed moving to and from a bed to a chair (including a wheelchair)?: Total Help needed standing up from a chair using your arms (e.g., wheelchair or bedside chair)?: Total Help needed to walk in hospital room?: Total Help needed climbing 3-5 steps with a railing? : Total 6 Click Score: 8    End of Session   Activity Tolerance: Patient limited by fatigue Patient left: in bed;with bed alarm set(Pt declined SCDs, no call bell in room CNA notified) Nurse Communication: Mobility status PT Visit Diagnosis: Other abnormalities of gait and mobility (R26.89);Muscle weakness (generalized) (M62.81);Difficulty in walking, not elsewhere classified (R26.2);Pain Pain - Right/Left: Left Pain - part of body: Hip    Time: 1191-4782 PT Time Calculation (min) (ACUTE ONLY): 38 min   Charges:   PT Evaluation $PT Eval Low Complexity: 1 Low PT Treatments $Therapeutic Exercise: 23-37 mins        Olga Coaster PT, DPT 10:30 AM,06/09/19 [phone]

## 2019-06-09 NOTE — NC FL2 (Signed)
[place] LEVEL OF CARE SCREENING TOOL     IDENTIFICATION  Patient Name: Tanner James Birthdate: June 05, 1956 Sex: male Admission Date (Current Location): 06/07/2019  [place] and [place] Number:  Engineering geologist and Address:  [facility], [address], [place], [place] [address]      Provider Number: [id number]  Attending Physician Name and Address:  Nolberto Hanlon, MD  Relative Name and Phone Number:  Oval Linsey [phone]    Current Level of Care: Hospital Recommended Level of Care: [place] Prior Approval Number:    Date Approved/Denied:   PASRR Number: [id number]  Discharge Plan: SNF    Current Diagnoses: Patient Active Problem List   Diagnosis Date Noted  . Closed nondisplaced intertrochanteric fracture of left femur ([place])   . Hip fracture ([place]) 06/07/2019  . AKI (acute kidney injury) ([place]) 06/07/2019  . Myasthenia gravis ([place]) 06/07/2019    Orientation RESPIRATION BLADDER Height & Weight     Self, Time, Situation, Place  Normal Continent Weight: 63.5 kg Height:  5\' 8"  (172.7 cm)  BEHAVIORAL SYMPTOMS/MOOD NEUROLOGICAL BOWEL NUTRITION STATUS      Continent    AMBULATORY STATUS COMMUNICATION OF NEEDS Skin     Verbally Surgical wounds                       Personal Care Assistance Level of Assistance              Functional Limitations Info             SPECIAL CARE FACTORS FREQUENCY  PT (By licensed PT)     PT Frequency: 5 itmes per week              Contractures Contractures Info: Not present    Additional Factors Info                  Current Medications (06/09/2019):  This is the current hospital active medication list Current Facility-Administered Medications  Medication Dose Route Frequency Provider Last Rate Last Admin  . 0.9 %  sodium chloride infusion   Intravenous Continuous Leim Fabry, MD 75 mL/hr at 06/08/19 1808 Rate Verify at 06/08/19 1808  .  acetaminophen (TYLENOL) tablet 1,000 mg  1,000 mg Oral Q8H Leim Fabry, MD   1,000 mg at 06/09/19 1401  . acetaminophen (TYLENOL) tablet 325-650 mg  325-650 mg Oral Q6H PRN Leim Fabry, MD      . bisacodyl (DULCOLAX) suppository 10 mg  10 mg Rectal Daily PRN Leim Fabry, MD      . docusate sodium (COLACE) capsule 100 mg  100 mg Oral BID Leim Fabry, MD   100 mg at 06/09/19 1002  . enoxaparin (LOVENOX) injection 40 mg  40 mg Subcutaneous Q24H Leim Fabry, MD   40 mg at 06/09/19 0819  . HYDROmorphone (DILAUDID) injection 0.25-0.5 mg  0.25-0.5 mg Intravenous Q2H PRN Leim Fabry, MD   0.5 mg at 06/09/19 0355  . methocarbamol (ROBAXIN) tablet 500 mg  500 mg Oral Q6H PRN Leim Fabry, MD   500 mg at 06/08/19 2122   Or  . methocarbamol (ROBAXIN) 500 mg in dextrose 5 % 50 mL IVPB  500 mg Intravenous Q6H PRN Leim Fabry, MD      . metoCLOPramide (REGLAN) tablet 5-10 mg  5-10 mg Oral Q8H PRN Leim Fabry, MD       Or  . metoCLOPramide (REGLAN) injection 5-10 mg  5-10 mg Intravenous Q8H PRN  Signa Kell, MD      . mycophenolate (CELLCEPT) capsule 1,000 mg  1,000 mg Oral BID Signa Kell, MD   1,000 mg at 06/09/19 1150  . ondansetron (ZOFRAN) tablet 4 mg  4 mg Oral Q6H PRN Signa Kell, MD       Or  . ondansetron [facility]) injection 4 mg  4 mg Intravenous Q6H PRN Signa Kell, MD      . oxyCODONE (Oxy IR/ROXICODONE) immediate release tablet 2.5-5 mg  2.5-5 mg Oral Q3H PRN Signa Kell, MD      . oxyCODONE (Oxy IR/ROXICODONE) immediate release tablet 5-10 mg  5-10 mg Oral Q4H PRN Signa Kell, MD      . predniSONE (DELTASONE) tablet 10 mg  10 mg Oral Daily Signa Kell, MD   10 mg at 06/09/19 1002  . pyridostigmine (MESTINON) tablet 60 mg  60 mg Oral TID Signa Kell, MD   60 mg at 06/09/19 1002  . senna-docusate (Senokot-S) tablet 1 tablet  1 tablet Oral QHS PRN Signa Kell, MD      . sodium phosphate (FLEET) 7-19 GM/118ML enema 1 enema  1 enema Rectal Once PRN Signa Kell, MD      . traMADol  Janean Sark) tablet 50 mg  50 mg Oral Q6H PRN Signa Kell, MD      . traZODone (DESYREL) tablet 50 mg  50 mg Oral QHS PRN Lynn Ito, MD         Discharge Medications: Please see discharge summary for a list of discharge medications.  Relevant Imaging Results:  Relevant Lab Results:   Additional Information [id number]  Barrie Dunker, RN

## 2019-06-09 NOTE — Progress Notes (Signed)
PROGRESS NOTE    Tanner James  PYP:[id number] DOB: Mar 11, 1956 DOA: 06/07/2019 PCP: [facility], [place] [facility]    Brief Narrative:  Tanner James Wardis a 63 y.o.malewith medical history significant ofmyasthenia gravis affecting his bulbar muscles who presents with concerns of a fall.Hip x-ray showed comminuted intertrochanteric fracture of the proximal left femur without any significant displacement or angulation.Orthopedics was consulted    Consultants:   Orthopedic  Procedures: Intramedullary nailing ofLeftfemur with cephalomedullary device  Antimicrobials:  Cefazolin  Subjective: Lots of pain with ambulation with PT.  Denies any other symptoms  Objective: Vitals:   06/08/19 1141 06/08/19 1546 06/08/19 2247 06/09/19 0742  BP: 111/69 (!) 155/100 (!) 163/85 (!) 143/93  Pulse: 83  (!) 103 93  Resp: 16  18 16   Temp: (!) 97.5 F (36.4 C)  97.8 F (36.6 C) (!) 97.3 F (36.3 C)  TempSrc: Oral  Oral Oral  SpO2: 97%  97% 96%  Weight:      Height:        Intake/Output Summary (Last 24 hours) at 06/09/2019 1517 Last data filed at 06/09/2019 1400 Gross per 24 hour  Intake 1594.83 ml  Output 2675 ml  Net -1080.17 ml   Filed Weights   06/07/19 1534  Weight: 63.5 kg    Examination:  General exam: Appears calm and comfortable, NAD Respiratory system: Clear to auscultation. Respiratory effort normal.  No wheeze rales rhonchi's Cardiovascular system: S1 & S2 heard, RRR. No  murmurs, rubs, gallops or clicks.  Gastrointestinal system: Abdomen is nondistended, soft and nontender. Normal bowel sounds heard. Central nervous system: Alert and oriented. No focal neurological deficits. Extremities: No edema or cyanosis Skin: Warm dry Psychiatry: Judgement and insight appear normal. Mood & affect appropriate.     Data Reviewed: I have personally reviewed following labs and imaging studies  CBC: Recent Labs  Lab 06/07/19 1554 06/08/19 0442 06/09/19 0428  WBC 9.3 9.4 9.4   NEUTROABS 8.4*  --   --   HGB 13.8 12.9* 12.6*  HCT 41.8 38.7* 35.6*  MCV 97.9 98.5 93.2  PLT 248 217 245   Basic Metabolic Panel: Recent Labs  Lab 06/07/19 1554 06/08/19 0442 06/09/19 0428  NA 139 138 136  K 4.1 3.6 3.6  CL 104 106 102  CO2 26 25 24   GLUCOSE 129* 110* 112*  BUN 20 15 8   CREATININE 1.42* 0.90 0.69  CALCIUM 8.6* 8.3* 8.1*   GFR: Estimated Creatinine Clearance: 86 mL/min (by C-G formula based on SCr of 0.69 mg/dL). Liver Function Tests: No results for input(s): AST, ALT, ALKPHOS, BILITOT, PROT, ALBUMIN in the last 168 hours. No results for input(s): LIPASE, AMYLASE in the last 168 hours. No results for input(s): AMMONIA in the last 168 hours. Coagulation Profile: Recent Labs  Lab 06/07/19 1554  INR 1.0   Cardiac Enzymes: No results for input(s): CKTOTAL, CKMB, CKMBINDEX, TROPONINI in the last 168 hours. BNP (last 3 results) No results for input(s): PROBNP in the last 8760 hours. HbA1C: No results for input(s): HGBA1C in the last 72 hours. CBG: No results for input(s): GLUCAP in the last 168 hours. Lipid Profile: No results for input(s): CHOL, HDL, LDLCALC, TRIG, CHOLHDL, LDLDIRECT in the last 72 hours. Thyroid Function Tests: No results for input(s): TSH, T4TOTAL, FREET4, T3FREE, THYROIDAB in the last 72 hours. Anemia Panel: No results for input(s): VITAMINB12, FOLATE, FERRITIN, TIBC, IRON, RETICCTPCT in the last 72 hours. Sepsis Labs: No results for input(s): PROCALCITON, LATICACIDVEN in the last 168 hours.  Recent Results (  from the past 240 hour(s))  Respiratory Panel by RT PCR (Flu A&B, Covid) - Nasopharyngeal Swab     Status: None   Collection Time: 06/07/19  6:43 PM   Specimen: Nasopharyngeal Swab  Result Value Ref Range Status   SARS Coronavirus 2 by RT PCR NEGATIVE NEGATIVE Final    Comment: (NOTE) SARS-CoV-2 target nucleic acids are NOT DETECTED. The SARS-CoV-2 RNA is generally detectable in upper respiratoy specimens during the acute  phase of infection. The lowest concentration of SARS-CoV-2 viral copies this assay can detect is 131 copies/mL. A negative result does not preclude SARS-Cov-2 infection and should not be used as the sole basis for treatment or other patient management decisions. A negative result may occur with  improper specimen collection/handling, submission of specimen other than nasopharyngeal swab, presence of viral mutation(s) within the areas targeted by this assay, and inadequate number of viral copies (<131 copies/mL). A negative result must be combined with clinical observations, patient history, and epidemiological information. The expected result is Negative. Fact Sheet for Patients:  https://www.moore.com/ Fact Sheet for Healthcare Providers:  https://www.young.biz/ This test is not yet ap proved or cleared by the [place] FDA and  has been authorized for detection and/or diagnosis of SARS-CoV-2 by FDA under an Emergency Use Authorization (EUA). This EUA will remain  in effect (meaning this test can be used) for the duration of the COVID-19 declaration under Section 564(b)(1) of the Act, 21 U.S.C. section 360bbb-3(b)(1), unless the authorization is terminated or revoked sooner.    Influenza A by PCR NEGATIVE NEGATIVE Final   Influenza B by PCR NEGATIVE NEGATIVE Final    Comment: (NOTE) The Xpert Xpress SARS-CoV-2/FLU/RSV assay is intended as an aid in  the diagnosis of influenza from Nasopharyngeal swab specimens and  should not be used as a sole basis for treatment. Nasal washings and  aspirates are unacceptable for Xpert Xpress SARS-CoV-2/FLU/RSV  testing. Fact Sheet for Patients: https://www.moore.com/ Fact Sheet for Healthcare Providers: https://www.young.biz/ This test is not yet approved or cleared by the [place] FDA and  has been authorized for detection and/or diagnosis of SARS-CoV-2 by   FDA under an Emergency Use Authorization (EUA). This EUA will remain  in effect (meaning this test can be used) for the duration of the  Covid-19 declaration under Section 564(b)(1) of the Act, 21  U.S.C. section 360bbb-3(b)(1), unless the authorization is  terminated or revoked. Performed at [facility], [address], [place], [place] [address]   Surgical pcr screen     Status: None   Collection Time: 06/08/19 12:03 AM   Specimen: Nasal Mucosa; Nasal Swab  Result Value Ref Range Status   MRSA, PCR NEGATIVE NEGATIVE Final   Staphylococcus aureus NEGATIVE NEGATIVE Final    Comment: (NOTE) The Xpert SA Assay (FDA approved for NASAL specimens in patients 37 years of age and older), is one component of a comprehensive surveillance program. It is not intended to diagnose infection nor to guide or monitor treatment. Performed at [facility], [address], [place], [place] [address]          Radiology Studies: DG Chest 1 View  Result Date: 06/07/2019 CLINICAL DATA:  Mechanical fall today. Left hip pain. Left femur fracture. EXAM: CHEST  1 VIEW COMPARISON:  None. FINDINGS: The heart size and mediastinal contours are within normal limits. Both lungs are clear. The visualized skeletal structures are unremarkable. IMPRESSION: Normal exam. Electronically Signed   By: Francene Boyers M.D.   On:  06/07/2019 16:55   DG HIP OPERATIVE UNILAT W OR W/O PELVIS LEFT  Result Date: 06/08/2019 CLINICAL DATA:  63 year old male status post left femur intertrochanteric fracture. EXAM: OPERATIVE LEFT HIP (WITH PELVIS IF PERFORMED) 7 VIEWS TECHNIQUE: Fluoroscopic spot image(s) were submitted for interpretation post-operatively. COMPARISON:  Left hip series 06/07/2019. FINDINGS: 7 intraoperative fluoroscopic spot views of the left femur demonstrate ORIF with intramedullary rod, proximal interlocking dynamic hip screw, and distal interlocking cortical screw. No adverse hardware  features. Near anatomic alignment. FLUOROSCOPY TIME:  0 minutes 54 seconds IMPRESSION: ORIF left femur with no adverse features. Electronically Signed   By: Odessa FlemingH  Hall M.D.   On: 06/08/2019 12:36   DG Hip Unilat W or Wo Pelvis 2-3 Views Left  Result Date: 06/07/2019 CLINICAL DATA:  Can ankle fall this afternoon. Complaining of left hip pain and inability to move the left leg. EXAM: DG HIP (WITH OR WITHOUT PELVIS) 2-3V LEFT COMPARISON:  None. FINDINGS: Comminuted intertrochanteric fracture of the proximal left femur. Primary fracture components nondisplaced and nonangulated. There is a mildly displaced fracture component from the lesser trochanter. No other fractures. Hip joints, SI joints and pubic symphysis are normally aligned. Soft tissues are unremarkable. IMPRESSION: 1. Comminuted intertrochanteric fracture of the proximal left femur with no significant displacement or angulation. Electronically Signed   By: Amie Portlandavid  Ormond M.D.   On: 06/07/2019 16:54        Scheduled Meds: . acetaminophen  1,000 mg Oral Q8H  . docusate sodium  100 mg Oral BID  . enoxaparin (LOVENOX) injection  40 mg Subcutaneous Q24H  . mycophenolate  1,000 mg Oral BID  . predniSONE  10 mg Oral Daily  . pyridostigmine  60 mg Oral TID   Continuous Infusions: . sodium chloride 75 mL/hr at 06/08/19 1808  . methocarbamol (ROBAXIN) IV      Assessment & Plan:   Principal Problem:   Hip fracture (HCC) Active Problems:   AKI (acute kidney injury) (HCC)   Myasthenia gravis (HCC)   Closed nondisplaced intertrochanteric fracture of left femur (HCC)  Comminuted intertrochanteric fracture of the proximal left femur Status post Intramedullary nailing ofLeftfemur with cephalomedullary device 06/08/19 PT/OT recommend SNF Pain control   AKI -Creatinine of 1.4 on admission Prerenal and improved stabilized with hydration  Myasthenia gravis affecting bulbar muscles -Speech eval -Continue rivastigmine, cellcept and  prednisone   DVT prophylaxis: Lovenox  code Status: Full Family Communication: None Disposition Plan: SNF pending       LOS: 2 days   Time spent: 45 minutes with more than 50% COC discussed with case management    Lynn ItoSahar Rachana Malesky, MD [facility] Pager [phone]  If 7PM-7AM, please contact night-coverage [email or web address] Password [email or web address] 06/09/2019, 3:17 PM

## 2019-06-09 NOTE — TOC Initial Note (Signed)
Transition of Care [facility]) - Initial/Assessment Note    Patient Details  Name: Tanner James MRN: [id number] Date of Birth: 03-Jul-1955  Transition of Care [facility]) CM/SW Contact:    Su Hilt, RN Phone Number: 06/09/2019, 4:09 PM  Clinical Narrative:                  Patient lives at home with his wife, he will need rehab services after surgery, FL2, PASSR and bed search completed, will review bed offers once obtained, will get insurance auth once chose a bed       Patient Goals and CMS Choice        Expected Discharge Plan and Services                                                Prior Living Arrangements/Services                       Activities of Daily Living Home Assistive Devices/Equipment: None ADL Screening (condition at time of admission) Patient's cognitive ability adequate to safely complete daily activities?: No Is the patient deaf or have difficulty hearing?: No Does the patient have difficulty seeing, even when wearing glasses/contacts?: No Does the patient have difficulty concentrating, remembering, or making decisions?: No Patient able to express need for assistance with ADLs?: Yes Does the patient have difficulty dressing or bathing?: No Independently performs ADLs?: Yes (appropriate for developmental age) Does the patient have difficulty walking or climbing stairs?: No Weakness of Legs: Left Weakness of Arms/Hands: None  Permission Sought/Granted                  Emotional Assessment              Admission diagnosis:  Hip fracture ([place]) [S72.009A] AKI (acute kidney injury) ([place]) [N17.9] Closed nondisplaced intertrochanteric fracture of left femur, initial encounter ([place]) [S72.145A] Patient Active Problem List   Diagnosis Date Noted  . Closed nondisplaced intertrochanteric fracture of left femur ([place])   . Hip fracture ([place]) 06/07/2019  . AKI (acute kidney injury) ([place]) 06/07/2019  . Myasthenia gravis ([place])  06/07/2019   PCP:  Care, [place] Primary Pharmacy:   [facility] [place], [place] [facility] [address] [place] [facility] [place] [address] Phone: [phone] Fax: [phone]     Social Determinants of Health (SDOH) Interventions    Readmission Risk Interventions No flowsheet data found.

## 2019-06-09 NOTE — Progress Notes (Signed)
Physical Therapy Treatment Patient Details Name: Tanner James MRN: [id number] DOB: [birth year] Today's Date: 06/09/2019    History of Present Illness Patient is 63 yo male s/p L hip nailing due to fall at home. PMH of myasthenia gravis    PT Comments    The patient demonstrated improvement towards goals this session. Improved tolerance to exercises and upright positioning this session. The patient continued to need maximum encouragement to participate and max verbal cues for therapeutic exercises. The patient was modAx2 to sit EOB, and maxAx2 for sit to stand with RW. Once in standing the patient was able to place weight in LLE. Pt educated on step to gait pattern, very motivated to utilize this technique. Pt up in chair with all needs in reach at end of session.  CGA x1-2 with close chair follow in room for ~26ft. The patient would benefit from further skilled PT intervention to maximize mobility and safety.    Follow Up Recommendations  SNF     Equipment Recommendations  Rolling walker with 5" wheels    Recommendations for Other Services       Precautions / Restrictions Precautions Precautions: Fall Precaution Comments: IM nailing Restrictions Weight Bearing Restrictions: Yes LLE Weight Bearing: Weight bearing as tolerated    Mobility  Bed Mobility Overal bed mobility: Needs Assistance Bed Mobility: Supine to Sit;Sit to Supine     Supine to sit: Mod assist;+2 for safety/equipment     General bed mobility comments: pt deferred 2/2 pain  Transfers Overall transfer level: Needs assistance Equipment used: Rolling walker (2 wheeled) Transfers: Sit to/from Stand Sit to Stand: Max assist;+2 physical assistance         General transfer comment: Pt preferred NWB on LLE, progressed to placing LLE foot flat on the floor  Ambulation/Gait   Gait Distance (Feet): 8 Feet Assistive device: Rolling walker (2 wheeled)   Gait velocity: decreased   General Gait Details: Pt  educated on step to gait pattern, very motivated to utilize this technique. CGA x1-2 with close chair follow in room.   Stairs             Wheelchair Mobility    Modified Rankin (Stroke Patients Only)       Balance Overall balance assessment: Needs assistance Sitting-balance support: Feet supported Sitting balance-Leahy Scale: Poor Sitting balance - Comments: reliant on UE support or CGA to maintain     Standing balance-Leahy Scale: Poor Standing balance comment: reliant on RW                            Cognition Arousal/Alertness: Awake/alert Behavior During Therapy: WFL for tasks assessed/performed Overall Cognitive Status: Within Functional Limits for tasks assessed                                        Exercises General Exercises - Lower Extremity Ankle Circles/Pumps: AROM;Both;10 reps;Strengthening Heel Slides: Left;10 reps;AROM Hip ABduction/ADduction: AAROM;Left;10 reps Other Exercises Other Exercises: Pt instructed in use of AE/DME for ADL to improve independence /safety; pt verbalized understanding but would benefit from additional instruction to support recall and carryover Other Exercises: Pt instructed in bed mobility to improve comfort/minimize pain and improve independence with repositioning as needed for pressure relief    General Comments        Pertinent Vitals/Pain Pain Assessment: 0-10 Pain Score: 7  Pain Location:  L hip pain with mobility, otherwise was a 5/10 at rest, 7/10 with mobility Pain Descriptors / Indicators: Aching;Grimacing Pain Intervention(s): Limited activity within patient's tolerance;Monitored during session;Repositioned    Home Living Family/patient expects to be discharged to:: Private residence Living Arrangements: Spouse/significant other Available Help at Discharge: Available 24 hours/day Type of Home: House Home Access: Stairs to enter Entrance Stairs-Rails: Right Home Layout: Two  level;Able to live on main level with bedroom/bathroom Home Equipment: Grab bars - tub/shower Additional Comments: Pt has equipment for his wife, he does not have any AD.    Prior Function Level of Independence: Independent          PT Goals (current goals can now be found in the care plan section) Acute Rehab PT Goals Patient Stated Goal: to decrease pain Progress towards PT goals: Progressing toward goals    Frequency    BID      PT Plan Current plan remains appropriate    Co-evaluation              AM-PAC PT "6 Clicks" Mobility   Outcome Measure  Help needed turning from your back to your side while in a flat bed without using bedrails?: A Lot Help needed moving from lying on your back to sitting on the side of a flat bed without using bedrails?: A Lot Help needed moving to and from a bed to a chair (including a wheelchair)?: A Lot Help needed standing up from a chair using your arms (e.g., wheelchair or bedside chair)?: A Lot Help needed to walk in hospital room?: A Lot Help needed climbing 3-5 steps with a railing? : Total 6 Click Score: 11    End of Session Equipment Utilized During Treatment: Gait belt Activity Tolerance: Patient limited by fatigue Patient left: in chair;with chair alarm set;with call bell/phone within reach Nurse Communication: Mobility status PT Visit Diagnosis: Other abnormalities of gait and mobility (R26.89);Muscle weakness (generalized) (M62.81);Difficulty in walking, not elsewhere classified (R26.2);Pain Pain - Right/Left: Left Pain - part of body: Hip     Time: 1062-6948 PT Time Calculation (min) (ACUTE ONLY): 30 min  Charges:  $Gait Training: 8-22 mins $Therapeutic Exercise: 8-22 mins                     Lieutenant Diego PT, DPT 3:19 PM,06/09/19 [phone]

## 2019-06-10 LAB — CBC
HCT: 35.4 % — ABNORMAL LOW (ref 39.0–52.0)
Hemoglobin: 12.4 g/dL — ABNORMAL LOW (ref 13.0–17.0)
MCH: 32.3 pg (ref 26.0–34.0)
MCHC: 35 g/dL (ref 30.0–36.0)
MCV: 92.2 fL (ref 80.0–100.0)
Platelets: 202 10*3/uL (ref 150–400)
RBC: 3.84 MIL/uL — ABNORMAL LOW (ref 4.22–5.81)
RDW: 13 % (ref 11.5–15.5)
WBC: 9.5 10*3/uL (ref 4.0–10.5)
nRBC: 0 % (ref 0.0–0.2)

## 2019-06-10 LAB — BASIC METABOLIC PANEL
Anion gap: 11 (ref 5–15)
BUN: 15 mg/dL (ref 8–23)
CO2: 24 mmol/L (ref 22–32)
Calcium: 8.4 mg/dL — ABNORMAL LOW (ref 8.9–10.3)
Chloride: 104 mmol/L (ref 98–111)
Creatinine, Ser: 0.77 mg/dL (ref 0.61–1.24)
GFR calc Af Amer: >60 mL/min (ref >60)
GFR calc non Af Amer: >60 mL/min (ref >60)
Glucose, Bld: 98 mg/dL (ref 70–99)
Potassium: 3.5 mmol/L (ref 3.5–5.1)
Sodium: 139 mmol/L (ref 135–145)

## 2019-06-10 LAB — GLUCOSE, CAPILLARY: Glucose-Capillary: 95 mg/dL (ref 70–99)

## 2019-06-10 MED ORDER — ENOXAPARIN SODIUM 40 MG/0.4ML ~~LOC~~ SOLN: 40.0000 mg | SUBCUTANEOUS | 0 refills | Status: AC

## 2019-06-10 MED ORDER — ACETAMINOPHEN 325 MG PO TABS: 325.0000 mg | ORAL_TABLET | Freq: Four times a day (QID) | ORAL | Status: AC | PRN

## 2019-06-10 MED ORDER — OXYCODONE HCL 5 MG PO TABS: 2.5000 mg | ORAL_TABLET | ORAL | 0 refills | Status: AC | PRN

## 2019-06-10 MED ORDER — TRAMADOL HCL 50 MG PO TABS: 50.0000 mg | ORAL_TABLET | Freq: Four times a day (QID) | ORAL | 0 refills | Status: AC | PRN

## 2019-06-10 NOTE — Progress Notes (Addendum)
Physical Therapy Treatment Patient Details Name: Tanner James MRN: [id number] DOB: Oct 05, 1955 Today's Date: 06/10/2019    History of Present Illness Patient is 63 yo male s/p L hip nailing due to fall at home. PMH of myasthenia gravis    PT Comments    Patient up in chair at start of session, pt agreeable to PT with min motivation. The patient was able to increase his ambulation distance this session by a few feet, continued to demonstrate step to gait pattern and heavily reliance on UE with RW. Cued for foot flat on LLE with occasional ability to perform. Patient needed several standing rest breaks and cues for breathing while walking, and endorsed arm fatigue limited his ability to continue to ambulate. The patient was up in chair at end of session with all needs in reach. The patient would benefit from further skilled PT intervention to continue to progress towards goals.     Follow Up Recommendations  SNF     Equipment Recommendations  Rolling walker with 5" wheels(If patient is to go home he will need RW and 3in1)    Recommendations for Other Services       Precautions / Restrictions Precautions Precautions: Fall Precaution Comments: IM nailing Restrictions Weight Bearing Restrictions: Yes LLE Weight Bearing: Weight bearing as tolerated    Mobility  Bed Mobility               General bed mobility comments: pt up in chair at start of session  Transfers Overall transfer level: Needs assistance Equipment used: Rolling walker (2 wheeled) Transfers: Sit to/from Stand Sit to Stand: Mod assist         General transfer comment: Pt able to place LLE on ground this session during transfers, though RLE continues to be maximum weight bearing limb  Ambulation/Gait Ambulation/Gait assistance: Min guard;+2 safety/equipment Gait Distance (Feet): 12 Feet Assistive device: Rolling walker (2 wheeled)   Gait velocity: decreased   General Gait Details: step to gait pattern  throughout. cued for foot flat on floor (LLE) pt able to comply occasionally   Stairs             Wheelchair Mobility    Modified Rankin (Stroke Patients Only)       Balance Overall balance assessment: Needs assistance Sitting-balance support: Feet supported Sitting balance-Leahy Scale: Poor Sitting balance - Comments: reliant on UE support or CGA to maintain       Standing balance comment: reliant on RW                            Cognition Arousal/Alertness: Awake/alert Behavior During Therapy: WFL for tasks assessed/performed Overall Cognitive Status: Within Functional Limits for tasks assessed                                        Exercises General Exercises - Lower Extremity Short Arc Quad: AROM;Strengthening;Left;10 reps Long Arc Quad: AAROM;Strengthening;Left;10 reps    General Comments        Pertinent Vitals/Pain Pain Assessment: Faces Pain Score: 5  Pain Location: L hip Pain Descriptors / Indicators: Aching;Grimacing;Moaning Pain Intervention(s): Limited activity within patient's tolerance;Monitored during session;Repositioned    Home Living                      Prior Function  PT Goals (current goals can now be found in the care plan section) Progress towards PT goals: Progressing toward goals    Frequency    BID      PT Plan Current plan remains appropriate    Co-evaluation              AM-PAC PT "6 Clicks" Mobility   Outcome Measure  Help needed turning from your back to your side while in a flat bed without using bedrails?: A Lot Help needed moving from lying on your back to sitting on the side of a flat bed without using bedrails?: A Lot Help needed moving to and from a bed to a chair (including a wheelchair)?: A Lot Help needed standing up from a chair using your arms (e.g., wheelchair or bedside chair)?: A Lot Help needed to walk in hospital room?: A Lot Help needed  climbing 3-5 steps with a railing? : Total 6 Click Score: 11    End of Session Equipment Utilized During Treatment: Gait belt Activity Tolerance: Patient limited by fatigue Patient left: in chair;with chair alarm set;with call bell/phone within reach Nurse Communication: Mobility status PT Visit Diagnosis: Other abnormalities of gait and mobility (R26.89);Muscle weakness (generalized) (M62.81);Difficulty in walking, not elsewhere classified (R26.2);Pain Pain - Right/Left: Left Pain - part of body: Hip     Time: 4193-7902 PT Time Calculation (min) (ACUTE ONLY): 23 min  Charges:  $Therapeutic Exercise: 23-37 mins                     Lieutenant Diego PT, DPT 12:59 PM,06/10/19

## 2019-06-10 NOTE — Progress Notes (Signed)
Pt d/c to home via son. IVs removed intact. VSS. Education completed. All questions answered. Belongings sent with pt.

## 2019-06-10 NOTE — Discharge Summary (Signed)
Tanner James SWF:[id number] DOB: 1956/06/22 DOA: 06/07/2019  PCP: [facility]  Admit date: 06/07/2019 Discharge date: 06/10/2019  Admitted From: home Disposition:  home  Recommendations for Outpatient Follow-up:  1. Follow up with PCP in 1 week 2. Please obtain BMP/CBC in one week 3. Please follow up on the following pending results:none 4. Follow up with orthopedics in 2 weeks-[facility]  Home Health:yes   Discharge Condition:Stable CODE STATUS:full  Diet recommendation: Heart Healthy / Carb Modified / Regular / Dysphagia  Brief/Interim Summary: Tanner James a 64 y.o.malewith medical history significant ofmyasthenia gravis affecting his bulbar muscles who presents with concerns of a fall.Hip x-ray showed comminuted intertrochanteric fracture of the proximal left femur without any significant displacement or angulation.Orthopedics was consulted.  Patient underwent status post intramedullary nailing ofLeftfemur with cephalomedullary device on 06/07/2019.  Physical therapy and Occupational Therapy were consulted.  Initially they said SNF.  Today patient was doing better and he wanted to go home with home health.  He is stable to be discharged from medical standpoint.  Also on admission he was found with mild AKI with creatinine of 1.4 which was prerenal and it improved with IV hydration.  His medications for his myasthenia gravis was continued as inpatient.    Discharge Diagnoses:  Principal Problem:   Hip fracture ([place]) Active Problems:   AKI (acute kidney injury) ([place])   Myasthenia gravis ([place])   Closed nondisplaced intertrochanteric fracture of left femur [facility])    Discharge Instructions  Discharge Instructions    Call MD for:  temperature >100.4   Complete by: As directed    Diet - low sodium heart healthy   Complete by: As directed    Increase activity slowly   Complete by: As directed      Allergies as of 06/10/2019   No Known Allergies      Medication List    TAKE these medications   acetaminophen 325 MG tablet Commonly known as: TYLENOL Take 1-2 tablets (325-650 mg total) by mouth every 6 (six) hours as needed for mild pain (pain score 1-3 or temp > 100.5).   enoxaparin 40 MG/0.4ML injection Commonly known as: LOVENOX Inject 0.4 mLs (40 mg total) into the skin daily for 14 days.   mycophenolate 500 MG tablet Commonly known as: CELLCEPT Take 1,000 mg by mouth 2 (two) times daily.   oxyCODONE 5 MG immediate release tablet Commonly known as: Oxy IR/ROXICODONE Take 0.5-1 tablets (2.5-5 mg total) by mouth every 4 (four) hours as needed for moderate pain or severe pain (pain score 4-6).   predniSONE 10 MG tablet Commonly known as: DELTASONE Take 10 mg by mouth daily.   pyridostigmine 60 MG tablet Commonly known as: MESTINON Take 60 mg by mouth 3 (three) times daily.   traMADol 50 MG tablet Commonly known as: ULTRAM Take 1 tablet (50 mg total) by mouth every 6 (six) hours as needed for moderate pain.            Durable Medical Equipment  (From admission, onward)         Start     Ordered   06/10/19 1125  DME 3-in-1  Once     06/10/19 1125   06/10/19 1124  For home use only DME Walker rolling  [facility])  Once    Comments: 5"  Question:  Patient needs a walker to treat with the following condition  Answer:  Dependent on walker for ambulation   06/10/19 1125  Follow-up Information    Dedra Skeens, [place]. Schedule an appointment as soon as possible for a visit in 2 day(s).   Specialty: Orthopedic Surgery Why: For staple removal and x-rays Contact information: [address] [place] [address] [phone]          No Known Allergies  Consultations:  Orthopedics   Procedures/Studies: DG Chest 1 View  Result Date: 06/07/2019 CLINICAL DATA:  Mechanical fall today. Left hip pain. Left femur fracture. EXAM: CHEST  1 VIEW COMPARISON:  None. FINDINGS: The  heart size and mediastinal contours are within normal limits. Both lungs are clear. The visualized skeletal structures are unremarkable. IMPRESSION: Normal exam. Electronically Signed   By: Francene Boyers M.D.   On: 06/07/2019 16:55   DG HIP OPERATIVE UNILAT W OR W/O PELVIS LEFT  Result Date: 06/08/2019 CLINICAL DATA:  63 year old male status post left femur intertrochanteric fracture. EXAM: OPERATIVE LEFT HIP (WITH PELVIS IF PERFORMED) 7 VIEWS TECHNIQUE: Fluoroscopic spot image(s) were submitted for interpretation post-operatively. COMPARISON:  Left hip series 06/07/2019. FINDINGS: 7 intraoperative fluoroscopic spot views of the left femur demonstrate ORIF with intramedullary rod, proximal interlocking dynamic hip screw, and distal interlocking cortical screw. No adverse hardware features. Near anatomic alignment. FLUOROSCOPY TIME:  0 minutes 54 seconds IMPRESSION: ORIF left femur with no adverse features. Electronically Signed   By: Odessa Fleming M.D.   On: 06/08/2019 12:36   DG Hip Unilat W or Wo Pelvis 2-3 Views Left  Result Date: 06/07/2019 CLINICAL DATA:  Can ankle fall this afternoon. Complaining of left hip pain and inability to move the left leg. EXAM: DG HIP (WITH OR WITHOUT PELVIS) 2-3V LEFT COMPARISON:  None. FINDINGS: Comminuted intertrochanteric fracture of the proximal left femur. Primary fracture components nondisplaced and nonangulated. There is a mildly displaced fracture component from the lesser trochanter. No other fractures. Hip joints, SI joints and pubic symphysis are normally aligned. Soft tissues are unremarkable. IMPRESSION: 1. Comminuted intertrochanteric fracture of the proximal left femur with no significant displacement or angulation. Electronically Signed   By: Amie Portland M.D.   On: 06/07/2019 16:54       Subjective: Patient seen and examined.  Has no new complaints.  Just hip pain.  No shortness of breath or chest pain or any other symptoms  Discharge Exam: Vitals:    06/10/19 0113 06/10/19 0746  BP: 109/71 118/69  Pulse: 80 94  Resp: 18 18  Temp: 98.9 F (37.2 C) (!) 97 F (36.1 C)  SpO2: 98% 98%   Vitals:   06/09/19 1527 06/09/19 2129 06/10/19 0113 06/10/19 0746  BP: 125/71  109/71 118/69  Pulse: 96  80 94  Resp: Temp: 98.2 F (36.8 C) (!) 97.5 F (36.4 C) 98.9 F (37.2 C) (!) 97 F (36.1 C)  TempSrc: Oral Oral Oral   SpO2: 99%  98% 98%  Weight:      Height:        General: Pt is alert, awake, not in acute distress Cardiovascular: RRR, S1/S2 +, no rubs, no gallops Respiratory: CTA bilaterally, no wheezing, no rhonchi Abdominal: Soft, NT, ND, bowel sounds + Extremities: no edema, no cyanosis Neuro alert oriented x3 no focal deficits    The results of significant diagnostics from this hospitalization (including imaging, microbiology, ancillary and laboratory) are listed below for reference.     Microbiology: Recent Results (from the past 240 hour(s))  Respiratory Panel by RT PCR (Flu A&B, Covid) - Nasopharyngeal  Swab     Status: None   Collection Time: 06/07/19  6:43 PM   Specimen: Nasopharyngeal Swab  Result Value Ref Range Status   SARS Coronavirus 2 by RT PCR NEGATIVE NEGATIVE Final    Comment: (NOTE) SARS-CoV-2 target nucleic acids are NOT DETECTED. The SARS-CoV-2 RNA is generally detectable in upper respiratoy specimens during the acute phase of infection. The lowest concentration of SARS-CoV-2 viral copies this assay can detect is 131 copies/mL. A negative result does not preclude SARS-Cov-2 infection and should not be used as the sole basis for treatment or other patient management decisions. A negative result may occur with  improper specimen collection/handling, submission of specimen other than nasopharyngeal swab, presence of viral mutation(s) within the areas targeted by this assay, and inadequate number of viral copies (<131 copies/mL). A negative result must be combined with clinical observations,  patient history, and epidemiological information. The expected result is Negative. Fact Sheet for Patients:  https://www.moore.com/ Fact Sheet for Healthcare Providers:  https://www.young.biz/ This test is not yet ap proved or cleared by the [place] FDA and  has been authorized for detection and/or diagnosis of SARS-CoV-2 by FDA under an Emergency Use Authorization (EUA). This EUA will remain  in effect (meaning this test can be used) for the duration of the COVID-19 declaration under Section 564(b)(1) of the Act, 21 U.S.C. section 360bbb-3(b)(1), unless the authorization is terminated or revoked sooner.    Influenza A by PCR NEGATIVE NEGATIVE Final   Influenza B by PCR NEGATIVE NEGATIVE Final    Comment: (NOTE) The Xpert Xpress SARS-CoV-2/FLU/RSV assay is intended as an aid in  the diagnosis of influenza from Nasopharyngeal swab specimens and  should not be used as a sole basis for treatment. Nasal washings and  aspirates are unacceptable for Xpert Xpress SARS-CoV-2/FLU/RSV  testing. Fact Sheet for Patients: https://www.moore.com/ Fact Sheet for Healthcare Providers: https://www.young.biz/ This test is not yet approved or cleared by the [place] FDA and  has been authorized for detection and/or diagnosis of SARS-CoV-2 by  FDA under an Emergency Use Authorization (EUA). This EUA will remain  in effect (meaning this test can be used) for the duration of the  Covid-19 declaration under Section 564(b)(1) of the Act, 21  U.S.C. section 360bbb-3(b)(1), unless the authorization is  terminated or revoked. Performed at [facility], [address] [place], [place] [address]   Surgical pcr screen     Status: None   Collection Time: 06/08/19 12:03 AM   Specimen: Nasal Mucosa; Nasal Swab  Result Value Ref Range Status   MRSA, PCR NEGATIVE NEGATIVE Final   Staphylococcus aureus NEGATIVE  NEGATIVE Final    Comment: (NOTE) The Xpert SA Assay (FDA approved for NASAL specimens in patients 5 years of age and older), is one component of a comprehensive surveillance program. It is not intended to diagnose infection nor to guide or monitor treatment. Performed at [facility], [address] [place], [place] [address]      Labs: BNP (last 3 results) No results for input(s): BNP in the last 8760 hours. Basic Metabolic Panel: Recent Labs  Lab 06/07/19 1554 06/08/19 0442 06/09/19 0428 06/10/19 0518  NA 139 138 136 139  K 4.1 3.6 3.6 3.5  CL 104 106 102 104  CO2 GLUCOSE 129* 110* 112* 98  BUN CREATININE 1.42* 0.90 0.69 0.77  CALCIUM 8.6* 8.3* 8.1* 8.4*   Liver Function Tests: No results for  input(s): AST, ALT, ALKPHOS, BILITOT, PROT, ALBUMIN in the last 168 hours. No results for input(s): LIPASE, AMYLASE in the last 168 hours. No results for input(s): AMMONIA in the last 168 hours. CBC: Recent Labs  Lab 06/07/19 1554 06/08/19 0442 06/09/19 0428 06/10/19 0518  WBC 9.3 9.4 9.4 9.5  NEUTROABS 8.4*  --   --   --   HGB 13.8 12.9* 12.6* 12.4*  HCT 41.8 38.7* 35.6* 35.4*  MCV 97.9 98.5 93.2 92.2  PLT 248 217 197 202   Cardiac Enzymes: No results for input(s): CKTOTAL, CKMB, CKMBINDEX, TROPONINI in the last 168 hours. BNP: Invalid input(s): POCBNP CBG: Recent Labs  Lab 06/10/19 0743  GLUCAP 95   D-Dimer No results for input(s): DDIMER in the last 72 hours. Hgb A1c No results for input(s): HGBA1C in the last 72 hours. Lipid Profile No results for input(s): CHOL, HDL, LDLCALC, TRIG, CHOLHDL, LDLDIRECT in the last 72 hours. Thyroid function studies No results for input(s): TSH, T4TOTAL, T3FREE, THYROIDAB in the last 72 hours.  Invalid input(s): FREET3 Anemia work up No results for input(s): VITAMINB12, FOLATE, FERRITIN, TIBC, IRON, RETICCTPCT in the last 72 hours. Urinalysis No results found for: COLORURINE,  APPEARANCEUR, LABSPEC, PHURINE, GLUCOSEU, HGBUR, BILIRUBINUR, KETONESUR, PROTEINUR, UROBILINOGEN, NITRITE, LEUKOCYTESUR Sepsis Labs Invalid input(s): PROCALCITONIN,  WBC,  LACTICIDVEN Microbiology Recent Results (from the past 240 hour(s))  Respiratory Panel by RT PCR (Flu A&B, Covid) - Nasopharyngeal Swab     Status: None   Collection Time: 06/07/19  6:43 PM   Specimen: Nasopharyngeal Swab  Result Value Ref Range Status   SARS Coronavirus 2 by RT PCR NEGATIVE NEGATIVE Final    Comment: (NOTE) SARS-CoV-2 target nucleic acids are NOT DETECTED. The SARS-CoV-2 RNA is generally detectable in upper respiratoy specimens during the acute phase of infection. The lowest concentration of SARS-CoV-2 viral copies this assay can detect is 131 copies/mL. A negative result does not preclude SARS-Cov-2 infection and should not be used as the sole basis for treatment or other patient management decisions. A negative result may occur with  improper specimen collection/handling, submission of specimen other than nasopharyngeal swab, presence of viral mutation(s) within the areas targeted by this assay, and inadequate number of viral copies (<131 copies/mL). A negative result must be combined with clinical observations, patient history, and epidemiological information. The expected result is Negative. Fact Sheet for Patients:  https://www.moore.com/ Fact Sheet for Healthcare Providers:  https://www.young.biz/ This test is not yet ap proved or cleared by the [place] FDA and  has been authorized for detection and/or diagnosis of SARS-CoV-2 by FDA under an Emergency Use Authorization (EUA). This EUA will remain  in effect (meaning this test can be used) for the duration of the COVID-19 declaration under Section 564(b)(1) of the Act, 21 U.S.C. section 360bbb-3(b)(1), unless the authorization is terminated or revoked sooner.    Influenza A by PCR NEGATIVE  NEGATIVE Final   Influenza B by PCR NEGATIVE NEGATIVE Final    Comment: (NOTE) The Xpert Xpress SARS-CoV-2/FLU/RSV assay is intended as an aid in  the diagnosis of influenza from Nasopharyngeal swab specimens and  should not be used as a sole basis for treatment. Nasal washings and  aspirates are unacceptable for Xpert Xpress SARS-CoV-2/FLU/RSV  testing. Fact Sheet for Patients: https://www.moore.com/ Fact Sheet for Healthcare Providers: https://www.young.biz/ This test is not yet approved or cleared by the [place] FDA and  has been authorized for detection and/or diagnosis of SARS-CoV-2 by  FDA under an Emergency Use Authorization (  EUA). This EUA will remain  in effect (meaning this test can be used) for the duration of the  Covid-19 declaration under Section 564(b)(1) of the Act, 21  U.S.C. section 360bbb-3(b)(1), unless the authorization is  terminated or revoked. Performed at [facility], [address] [place], [place] [address]   Surgical pcr screen     Status: None   Collection Time: 06/08/19 12:03 AM   Specimen: Nasal Mucosa; Nasal Swab  Result Value Ref Range Status   MRSA, PCR NEGATIVE NEGATIVE Final   Staphylococcus aureus NEGATIVE NEGATIVE Final    Comment: (NOTE) The Xpert SA Assay (FDA approved for NASAL specimens in patients 62 years of age and older), is one component of a comprehensive surveillance program. It is not intended to diagnose infection nor to guide or monitor treatment. Performed at [facility], [address] [place], [place] [address]      Time coordinating discharge: Over 30 minutes  SIGNED:   Lynn ItoSahar Priscilla Kirstein, MD  [facility] 06/10/2019, 11:27 AM Pager   If 7PM-7AM, please contact night-coverage [email or web address] Password [email or web address]

## 2019-06-10 NOTE — Progress Notes (Signed)
  Subjective: 2 Days Post-Op Procedure(s) (LRB): INTRAMEDULLARY (IM) NAIL INTERTROCHANTRIC (Left) Patient reports pain as mild to moderate.   Patient is well, and has had no acute complaints or problems Plan is to go home after hospital stay. Negative for chest pain and shortness of breath Fever: no Gastrointestinal: Negative for nausea and vomiting  Objective: Vital signs in last 24 hours: Temp:  [97.3 F (36.3 C)-98.9 F (37.2 C)] 98.9 F (37.2 C) (12/15 0113) Pulse Rate:  [80-96] 80 (12/15 0113) Resp:  [16-18] 18 (12/15 0113) BP: (109-143)/(71-93) 109/71 (12/15 0113) SpO2:  [96 %-99 %] 98 % (12/15 0113)  Intake/Output from previous day:  Intake/Output Summary (Last 24 hours) at 06/10/2019 0658 Last data filed at 06/10/2019 0429 Gross per 24 hour  Intake 767.56 ml  Output 3475 ml  Net -2707.44 ml    Intake/Output this shift: Total I/O In: -  Out: 1100 [Urine:1100]  Labs: Recent Labs    06/07/19 1554 06/08/19 0442 06/09/19 0428 06/10/19 0518  HGB 13.8 12.9* 12.6* 12.4*   Recent Labs    06/09/19 0428 06/10/19 0518  WBC 9.4 9.5  RBC 3.82* 3.84*  HCT 35.6* 35.4*  PLT 197 202   Recent Labs    06/09/19 0428 06/10/19 0518  NA 136 139  K 3.6 3.5  CL 102 104  CO2 24 24  BUN 8 15  CREATININE 0.69 0.77  GLUCOSE 112* 98  CALCIUM 8.1* 8.4*   Recent Labs    06/07/19 1554  INR 1.0     EXAM General - Patient is Alert and Oriented Extremity - Neurovascular intact Sensation intact distally Dorsiflexion/Plantar flexion intact Compartment soft Dressing/Incision - clean, dry, with scant drainage Motor Function - intact, moving foot and toes well on exam.  Ambulated 8 feet with physical therapy.  Past Medical History:  Diagnosis Date  . Complication of anesthesia   . Concussion    8/17  . Medical history non-contributory   . Myasthenia gravis, adult form ([place])   . PONV (postoperative nausea and vomiting)    AFTER COLONOSCOPY     Assessment/Plan: 2 Days Post-Op Procedure(s) (LRB): INTRAMEDULLARY (IM) NAIL INTERTROCHANTRIC (Left) Principal Problem:   Hip fracture ([facility]) Active Problems:   AKI (acute kidney injury) ([place])   Myasthenia gravis ([place])   Closed nondisplaced intertrochanteric fracture of left femur ([facility])  Estimated body mass index is 21.29 kg/m as calculated from the following:   Height as of this encounter: 5\' 8"  (1.727 m).   Weight as of this encounter: 63.5 kg. Advance diet Up with therapy D/C IV fluids  Discharge planning with care management Follow-up at [facility] in 2 weeks for staple removal and x-rays.  DVT Prophylaxis - Lovenox, Foot Pumps and TED hose Weight-Bearing as tolerated to left leg  Reche Dixon, PA-C Orthopaedic Surgery 06/10/2019, 6:58 AM

## 2019-06-10 NOTE — TOC Transition Note (Signed)
Transition of Care (TOC) - CM/SW Discharge Note Met with patient to discuss discharge.  He does not want to go to inpatient rehab at this time, he would like to go home and continue rehab with [facility].  Home Health services provided by [facility].  DME: 3in1 BSC and RWalker provided by [employer].  Patient Details  Name: Naitik Hermann MRN: [id number] Date of Birth: 1955/10/13  Transition of Care [facility]) CM/SW Contact:  Victorino Dike, RN Phone Number: 06/10/2019, 9:54 AM   Clinical Narrative:       Final next level of care: [place] Barriers to Discharge: Barriers Resolved   Patient Goals and CMS Choice Patient states their goals for this hospitalization and ongoing recovery are:: To get better and be able to ride his new [place]. CMS Medicare.gov Compare Post Acute Care list provided to:: Patient Choice offered to / list presented to : Patient  Discharge Placement                       Discharge Plan and Services                DME Arranged: 3-N-1, Walker rolling with seat, Walker rolling DME Agency: [employer] Date DME Agency Contacted: 06/10/19 Time DME Agency Contacted: [phone] Representative spoke with at DME Agency: [place]oy [place] at [place]: [place]: [facility] [place] [facility] [employer]) Date [place]: 06/10/19 Time [place]: [phone] Representative spoke with at [place]: [place] ([place]) Interventions     Readmission Risk Interventions No flowsheet data found.

## 2019-06-10 NOTE — Progress Notes (Signed)
Pt post op day 3. No BM as of yet. Bowel sounds present in all quadrants. I offered pt a suppository. He declined. He stated " I don't have a bowel movement but once or twice a week with my myasthenias gravis. The Mestinon you started me back on yesterday will start working sometime soon and I will have regular bowel movements again." MD made aware. Will continue to monitor.

## 2019-06-12 ENCOUNTER — Encounter: Payer: Self-pay | Admitting: Anesthesiology

## 2019-06-12 NOTE — Anesthesia Postprocedure Evaluation (Signed)
Anesthesia Post Note  Patient: Tanner James  Procedure(s) Performed: INTRAMEDULLARY (IM) NAIL INTERTROCHANTRIC (Left )  Patient location during evaluation: PACU Anesthesia Type: MAC and Spinal Level of consciousness: oriented and awake and alert Pain management: pain level controlled Vital Signs Assessment: post-procedure vital signs reviewed and stable Respiratory status: spontaneous breathing and respiratory function stable Cardiovascular status: blood pressure returned to baseline and stable Postop Assessment: no headache, no backache, no apparent nausea or vomiting and spinal receding Anesthetic complications: no     Last Vitals:  Vitals:   06/10/19 0113 06/10/19 0746  BP: 109/71 118/69  Pulse: 80 94  Resp: 18 18  Temp: 37.2 C (!) 36.1 C  SpO2: 98% 98%    Last Pain:  Vitals:   06/10/19 0915  TempSrc:   PainSc: 0-No pain                 Alphonsus Sias

## 2020-08-17 ENCOUNTER — Ambulatory Visit: Payer: Managed Care, Other (non HMO) | Attending: Internal Medicine

## 2020-08-17 DIAGNOSIS — Z23 Encounter for immunization: Secondary | ICD-10-CM

## 2020-08-17 NOTE — Progress Notes (Signed)
   Covid-19 Vaccination Clinic  Name:  Tanner James    MRN: [id number] DOB: September 25, 1955  08/17/2020  Tanner James was observed post Covid-19 immunization for 15 minutes without incident. He was provided with Vaccine Information Sheet and instruction to access the V-Safe system.   Tanner James was instructed to call 911 with any severe reactions post vaccine: Marland Kitchen Difficulty breathing  . Swelling of face and throat  . A fast heartbeat  . A bad rash all over body  . Dizziness and weakness   Immunizations Administered    Name Date Dose VIS Date Route   Moderna Covid-19 Booster Vaccine 08/17/2020  1:10 PM 0.25 mL 04/14/2020 Intramuscular   Manufacturer: Moderna   Lot: [id number]   NDC: 46270-350-09

## 2020-09-06 ENCOUNTER — Ambulatory Visit: Payer: Managed Care, Other (non HMO)

## 2020-09-07 ENCOUNTER — Ambulatory Visit: Payer: Managed Care, Other (non HMO)

## 2021-04-17 ENCOUNTER — Emergency Department: Payer: Managed Care, Other (non HMO)

## 2021-04-17 ENCOUNTER — Emergency Department
Admission: EM | Admit: 2021-04-17 | Discharge: 2021-04-17 | Disposition: A | Discharge status: Home / Self Care | Payer: Managed Care, Other (non HMO) | Attending: Emergency Medicine | Admitting: Emergency Medicine

## 2021-04-17 DIAGNOSIS — S0993XA Unspecified injury of face, initial encounter: Secondary | ICD-10-CM | POA: Diagnosis present

## 2021-04-17 DIAGNOSIS — R6884 Jaw pain: Secondary | ICD-10-CM | POA: Diagnosis not present

## 2021-04-17 DIAGNOSIS — S0083XA Contusion of other part of head, initial encounter: Secondary | ICD-10-CM | POA: Insufficient documentation

## 2021-04-17 DIAGNOSIS — W010XXA Fall on same level from slipping, tripping and stumbling without subsequent striking against object, initial encounter: Secondary | ICD-10-CM | POA: Diagnosis not present

## 2021-04-17 MED ORDER — HYDROCODONE-ACETAMINOPHEN 5-325 MG PO TABS: 1.0000 | ORAL_TABLET | Freq: Four times a day (QID) | ORAL | 0 refills | Status: AC | PRN

## 2021-04-17 MED ORDER — MORPHINE SULFATE (PF) 4 MG/ML IV SOLN
4.0000 mg | Freq: Once | INTRAVENOUS | Status: AC
  Administered 2021-04-17: 4 mg via INTRAVENOUS
  Filled 2021-04-17: qty 1

## 2021-04-17 MED ORDER — MORPHINE SULFATE (PF) 2 MG/ML IV SOLN
2.0000 mg | Freq: Once | INTRAVENOUS | Status: AC
  Administered 2021-04-17: 2 mg via INTRAVENOUS
  Filled 2021-04-17: qty 1

## 2021-04-17 MED ORDER — ACETAMINOPHEN 10 MG/ML IV SOLN
1000.0000 mg | Freq: Four times a day (QID) | INTRAVENOUS | Status: DC
  Filled 2021-04-17 (×2): qty 100

## 2021-04-17 NOTE — ED Triage Notes (Signed)
Pt arrives via ems from home after having a fall around 1000. Pt states he tripped and struck his face and head on the floor. Fall was unwitnessed. Unsure if there was an loc. No blood thinners. Pt is currently AxOx4 and c/o pain in mouth and head. States it doesn't feel like his jaw is in alignment.

## 2021-04-17 NOTE — Discharge Instructions (Signed)
Take the Vicodin 1 pill 4 times a day as needed for pain.  Eat a mechanically soft diet.  Return or follow-up with your dentist for worsening pain or if the pain does not improve after couple days.

## 2021-04-17 NOTE — ED Notes (Signed)
Md at bedside for dc instructions. IV removed. Pt assisted to WR via wheelchair with family

## 2021-04-17 NOTE — ED Provider Notes (Addendum)
[facility] Emergency Department Provider Note   ____________________________________________   Event Date/Time   First MD Initiated Contact with Patient 04/17/21 1225     (approximate)  I have reviewed the triage vital signs and the nursing notes.   HISTORY  Chief Complaint Fall  HPI Tanner James is a 65 y.o. male who reports he was in his usual state of good health and tripped and fell hit his head on the floor.  He is not sure about loss of consciousness.  He complains of a bad headache.  He has no neck pain.  He reports he has a lot of pain in his jaw and his teeth are not fitting together properly.  He has a past history of concussion.         Past Medical History:  Diagnosis Date   Complication of anesthesia    Concussion    8/17   Medical history non-contributory    Myasthenia gravis, adult form (HCC)    PONV (postoperative nausea and vomiting)    AFTER COLONOSCOPY    Patient Active Problem List   Diagnosis Date Noted   Closed nondisplaced intertrochanteric fracture of left femur (HCC)    Hip fracture (HCC) 06/07/2019   AKI (acute kidney injury) (HCC) 06/07/2019   Myasthenia gravis (HCC) 06/07/2019    Past Surgical History:  Procedure Laterality Date   CATARACT EXTRACTION W/PHACO Right 12/28/2016   Procedure: CATARACT EXTRACTION PHACO AND INTRAOCULAR LENS PLACEMENT (IOC);  Surgeon: Lockie Mola, MD;  Location: [facility] ORS;  Service: Ophthalmology;  Laterality: Right;  [place] 00:29.6 AP% 17.5 CDE 5.19 Fluid Pack lot # [id number]   COLONSCOPY     INTRAMEDULLARY (IM) NAIL INTERTROCHANTERIC Left 06/08/2019   Procedure: INTRAMEDULLARY (IM) NAIL INTERTROCHANTRIC;  Surgeon: Signa Kell, MD;  Location: [facility] ORS;  Service: Orthopedics;  Laterality: Left;    Prior to Admission medications   Medication Sig Start Date End Date Taking? Authorizing Provider  HYDROcodone-acetaminophen (NORCO/VICODIN) 5-325 MG tablet Take 1 tablet by mouth every 6  (six) hours as needed for moderate pain. 04/17/21  Yes Arnaldo Natal, MD  acetaminophen (TYLENOL) 325 MG tablet Take 1-2 tablets (325-650 mg total) by mouth every 6 (six) hours as needed for mild pain (pain score 1-3 or temp > 100.5). 06/10/19   Lynn Ito, MD  enoxaparin (LOVENOX) 40 MG/0.4ML injection Inject 0.4 mLs (40 mg total) into the skin daily for 14 days. 06/10/19 06/24/19  Dedra Skeens, PA-C  mycophenolate (CELLCEPT) 500 MG tablet Take 1,000 mg by mouth 2 (two) times daily. 06/03/19   [provider]  oxyCODONE (OXY IR/ROXICODONE) 5 MG immediate release tablet Take 0.5-1 tablets (2.5-5 mg total) by mouth every 4 (four) hours as needed for moderate pain or severe pain (pain score 4-6). 06/10/19   Dedra Skeens, PA-C  predniSONE (DELTASONE) 10 MG tablet Take 10 mg by mouth daily. 05/27/19   [provider]  pyridostigmine (MESTINON) 60 MG tablet Take 60 mg by mouth 3 (three) times daily. 06/02/19   [provider]  traMADol (ULTRAM) 50 MG tablet Take 1 tablet (50 mg total) by mouth every 6 (six) hours as needed for moderate pain. 06/10/19   Dedra Skeens, PA-C    Allergies Patient has no known allergies.  No family history on file.  Social History Social History   Tobacco Use   Smoking status: Never   Smokeless tobacco: Never  Substance Use Topics   Alcohol use: Yes   Drug use: No  Review of Systems  Constitutional: No fever/chills Eyes: No visual changes. ENT: No sore throat. Cardiovascular: Denies chest pain. Respiratory: Denies shortness of breath. Gastrointestinal: No abdominal pain.  No nausea, no vomiting.  No diarrhea.  No constipation. Genitourinary: Negative for dysuria. Musculoskeletal: Negative for back pain. Skin: Negative for rash. Neurological: Negative for headaches, focal weakness or numbness  ____________________________________________   PHYSICAL EXAM:  VITAL SIGNS: ED Triage Vitals  Enc Vitals Group     BP 04/17/21  1209 (!) 144/83     Pulse Rate 04/17/21 1209 89     Resp 04/17/21 1209 20     Temp 04/17/21 1209 97.7 F (36.5 C)     Temp src --      SpO2 04/17/21 1209 99 %     Weight --      Height --      Head Circumference --      Peak Flow --      Pain Score 04/17/21 1211 9     Pain Loc --      Pain Edu? --      Excl. in GC? --     Constitutional: Alert and oriented.  Complaining of a lot of pain in his jaw and face and head Eyes: Conjunctivae are normal. PER EOMI. Head: Atraumatic. Nose: No congestion/rhinnorhea. Mouth/Throat: Mucous membranes are moist.  Oropharynx non-erythematous.  Patient has some dried blood on his lips.  Patient reports a lot of pain on trying to move his jaw.  I do not see any open fractures inside of his mouth. Neck: No stridor.   Cardiovascular: Normal rate, regular rhythm. Grossly normal heart sounds.  Good peripheral circulation. Respiratory: Normal respiratory effort.  No retractions. Lungs CTAB.  No chest tender Gastrointestinal: Soft and nontender. No distention. No abdominal bruits.  Musculoskeletal: No lower extremity tenderness nor edema.  No back tenderness to palpation Neurologic:  Normal speech and language. No gross focal neurologic deficits are appreciated.  Skin:  Skin is warm, dry and intact. No rash noted.   ____________________________________________   LABS (all labs ordered are listed, but only abnormal results are displayed)  Labs Reviewed - No data to display ____________________________________________  EKG   ____________________________________________  RADIOLOGY Jill Poling, personally viewed and evaluated these images (plain radiographs) as part of my medical decision making, as well as reviewing the written report by the radiologist.  ED MD interpretation: CT and plain films read by radiology reviewed by me are negative for mandibular fracture  Official radiology report(s): DG Mandible 4 Views  Result Date:  04/17/2021 CLINICAL DATA:  Larey Seat jaw pain especially on the right side has trouble opening closing his mouth EXAM: MANDIBLE - 4+ VIEW COMPARISON:  None. FINDINGS: Markedly limited evaluation due to overlapping osseous structures and overlying soft tissues. There is no evidence of fracture or other focal bone lesions. IMPRESSION: Markedly limited evaluation due to overlapping osseous structures and overlying soft tissues. Grossly negative. Consider cross-sectional imaging for further evaluation. Electronically Signed   By: Tish Frederickson M.D.   On: 04/17/2021 15:08   CT Head Wo Contrast  Result Date: 04/17/2021 CLINICAL DATA:  Fall from standing with facial trauma and pain. EXAM: CT HEAD WITHOUT CONTRAST CT MAXILLOFACIAL WITHOUT CONTRAST CT CERVICAL SPINE WITHOUT CONTRAST TECHNIQUE: Multidetector CT imaging of the head, cervical spine, and maxillofacial structures were performed using the standard protocol without intravenous contrast. Multiplanar CT image reconstructions of the cervical spine and maxillofacial structures were also generated. COMPARISON:  CT  head and neck dated 09/12/2017. FINDINGS: CT HEAD FINDINGS Brain: No evidence of acute infarction, hemorrhage, hydrocephalus, extra-axial collection or mass lesion/mass effect. Periventricular white matter hypoattenuation likely represents chronic small vessel ischemic disease. Vascular: There are vascular calcifications in the carotid siphons. Skull: Normal. Negative for fracture or focal lesion. Other: None. CT MAXILLOFACIAL FINDINGS Osseous: A questionable cortical defect of the right anterior nasal spine of the maxilla is seen on the axial views (series 3, images 50 3-54). Given the lack of significant soft tissue swelling or gas, this is favored to reflect a chronic finding as opposed to acute fracture. No mandibular dislocation. No destructive process. Orbits: Negative. No traumatic or inflammatory finding. Sinuses: There is mild right ethmoid and  right maxillary sinus disease. Soft tissues: Negative. CT CERVICAL SPINE FINDINGS Alignment: Normal. Skull base and vertebrae: No acute fracture. No primary bone lesion or focal pathologic process. Soft tissues and spinal canal: No prevertebral fluid or swelling. No visible canal hematoma. Disc levels: Up to moderate multilevel degenerative disc and joint disease. Upper chest: Negative. Other: None. IMPRESSION: 1. No acute intracranial process. 2. No acute osseous injury in the cervical spine. 3. Apparent cortical defect of the right anterior nasal spine of the maxilla is favored to reflect a chronic finding as opposed to an acute fracture. Correlation with point tenderness is recommended to assess for the acuity of this finding. Electronically Signed   By: Romona Curls M.D.   On: 04/17/2021 13:28   CT Cervical Spine Wo Contrast  Result Date: 04/17/2021 CLINICAL DATA:  Fall from standing with facial trauma and pain. EXAM: CT HEAD WITHOUT CONTRAST CT MAXILLOFACIAL WITHOUT CONTRAST CT CERVICAL SPINE WITHOUT CONTRAST TECHNIQUE: Multidetector CT imaging of the head, cervical spine, and maxillofacial structures were performed using the standard protocol without intravenous contrast. Multiplanar CT image reconstructions of the cervical spine and maxillofacial structures were also generated. COMPARISON:  CT head and neck dated 09/12/2017. FINDINGS: CT HEAD FINDINGS Brain: No evidence of acute infarction, hemorrhage, hydrocephalus, extra-axial collection or mass lesion/mass effect. Periventricular white matter hypoattenuation likely represents chronic small vessel ischemic disease. Vascular: There are vascular calcifications in the carotid siphons. Skull: Normal. Negative for fracture or focal lesion. Other: None. CT MAXILLOFACIAL FINDINGS Osseous: A questionable cortical defect of the right anterior nasal spine of the maxilla is seen on the axial views (series 3, images 50 3-54). Given the lack of significant soft  tissue swelling or gas, this is favored to reflect a chronic finding as opposed to acute fracture. No mandibular dislocation. No destructive process. Orbits: Negative. No traumatic or inflammatory finding. Sinuses: There is mild right ethmoid and right maxillary sinus disease. Soft tissues: Negative. CT CERVICAL SPINE FINDINGS Alignment: Normal. Skull base and vertebrae: No acute fracture. No primary bone lesion or focal pathologic process. Soft tissues and spinal canal: No prevertebral fluid or swelling. No visible canal hematoma. Disc levels: Up to moderate multilevel degenerative disc and joint disease. Upper chest: Negative. Other: None. IMPRESSION: 1. No acute intracranial process. 2. No acute osseous injury in the cervical spine. 3. Apparent cortical defect of the right anterior nasal spine of the maxilla is favored to reflect a chronic finding as opposed to an acute fracture. Correlation with point tenderness is recommended to assess for the acuity of this finding. Electronically Signed   By: Romona Curls M.D.   On: 04/17/2021 13:28   CT Maxillofacial Wo Contrast  Result Date: 04/17/2021 CLINICAL DATA:  Fall from standing with facial trauma and pain. EXAM:  CT HEAD WITHOUT CONTRAST CT MAXILLOFACIAL WITHOUT CONTRAST CT CERVICAL SPINE WITHOUT CONTRAST TECHNIQUE: Multidetector CT imaging of the head, cervical spine, and maxillofacial structures were performed using the standard protocol without intravenous contrast. Multiplanar CT image reconstructions of the cervical spine and maxillofacial structures were also generated. COMPARISON:  CT head and neck dated 09/12/2017. FINDINGS: CT HEAD FINDINGS Brain: No evidence of acute infarction, hemorrhage, hydrocephalus, extra-axial collection or mass lesion/mass effect. Periventricular white matter hypoattenuation likely represents chronic small vessel ischemic disease. Vascular: There are vascular calcifications in the carotid siphons. Skull: Normal. Negative for  fracture or focal lesion. Other: None. CT MAXILLOFACIAL FINDINGS Osseous: A questionable cortical defect of the right anterior nasal spine of the maxilla is seen on the axial views (series 3, images 50 3-54). Given the lack of significant soft tissue swelling or gas, this is favored to reflect a chronic finding as opposed to acute fracture. No mandibular dislocation. No destructive process. Orbits: Negative. No traumatic or inflammatory finding. Sinuses: There is mild right ethmoid and right maxillary sinus disease. Soft tissues: Negative. CT CERVICAL SPINE FINDINGS Alignment: Normal. Skull base and vertebrae: No acute fracture. No primary bone lesion or focal pathologic process. Soft tissues and spinal canal: No prevertebral fluid or swelling. No visible canal hematoma. Disc levels: Up to moderate multilevel degenerative disc and joint disease. Upper chest: Negative. Other: None. IMPRESSION: 1. No acute intracranial process. 2. No acute osseous injury in the cervical spine. 3. Apparent cortical defect of the right anterior nasal spine of the maxilla is favored to reflect a chronic finding as opposed to an acute fracture. Correlation with point tenderness is recommended to assess for the acuity of this finding. Electronically Signed   By: Romona Curls M.D.   On: 04/17/2021 13:28    ____________________________________________   PROCEDURES  Procedure(s) performed (including Critical Care):  Procedures   ____________________________________________   INITIAL IMPRESSION / ASSESSMENT AND PLAN / ED COURSE  Patient with a lot of pain in his jaw.  X-ray studies are negative.  Discussed with Dr. Jenne Campus ENT who recommends possibly some steroids for inflammation as he may have had a jaw contusion.  Patient has myasthenia and is already taking steroids I do not want to change his dose or make him worse in any way.  I will give him some Vicodin.  We will have him follow-up with his dentist or return to  referral or surgery if he is worse or no better in a couple days   ----------------------------------------- 3:34 PM on 04/17/2021 ----------------------------------------- I did warn the patient not to operate hazardous machinery or drive on the Vicodin because it could repeat result in grogginess and injury and/or take it for driving is an impaired driver.       ____________________________________________   FINAL CLINICAL IMPRESSION(S) / ED DIAGNOSES  Final diagnoses:  Contusion of ramus of mandible     ED Discharge Orders          Ordered    HYDROcodone-acetaminophen (NORCO/VICODIN) 5-325 MG tablet  Every 6 hours PRN        04/17/21 1525             Note:  This document was prepared using Dragon voice recognition software and may include unintentional dictation errors.    Arnaldo Natal, MD 04/17/21 1527    Arnaldo Natal, MD 04/17/21 1535

## 2023-05-06 IMAGING — CT CT CERVICAL SPINE W/O CM
3 of 4 series · 11 of 33 positions shown, 13 images · non-contrast
Comparison: CT head and neck dated 09/12/2017.

CLINICAL DATA: Fall from standing with facial trauma and pain.

EXAM:
CT HEAD WITHOUT CONTRAST
CT MAXILLOFACIAL WITHOUT CONTRAST
CT CERVICAL SPINE WITHOUT CONTRAST
TECHNIQUE: Multidetector CT imaging of the head, cervical spine, and
maxillofacial structures were performed using the standard protocol
without intravenous contrast. Multiplanar CT image reconstructions
of the cervical spine and maxillofacial structures were also
generated.

[Series 4: sagittal bone · sagittal · 0.27mm/px · 5 of 69 slices shown, 6 images]
[im 23/69  bone]
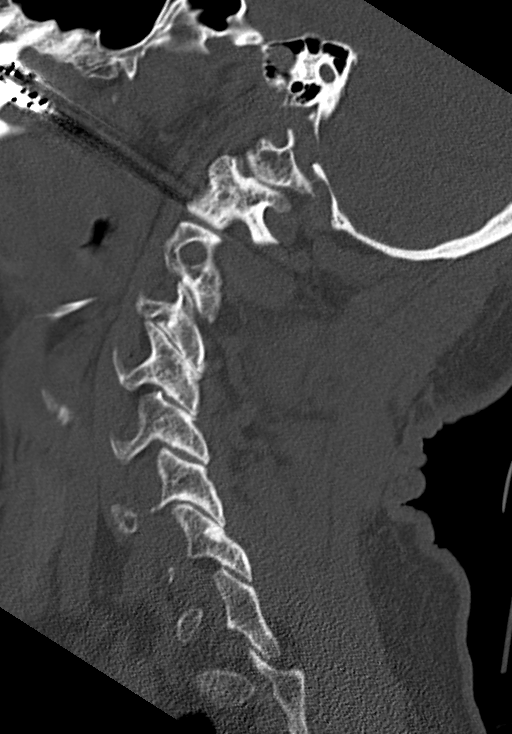
[im 29/69  bone]
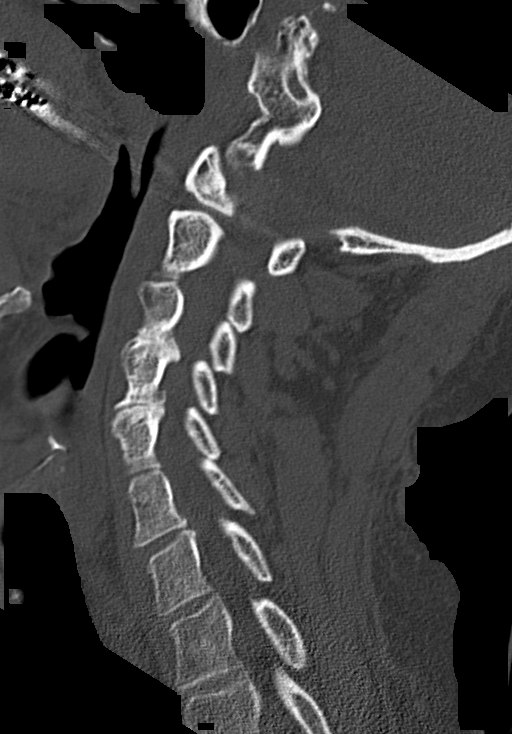
[im 35/69  soft-tissue]
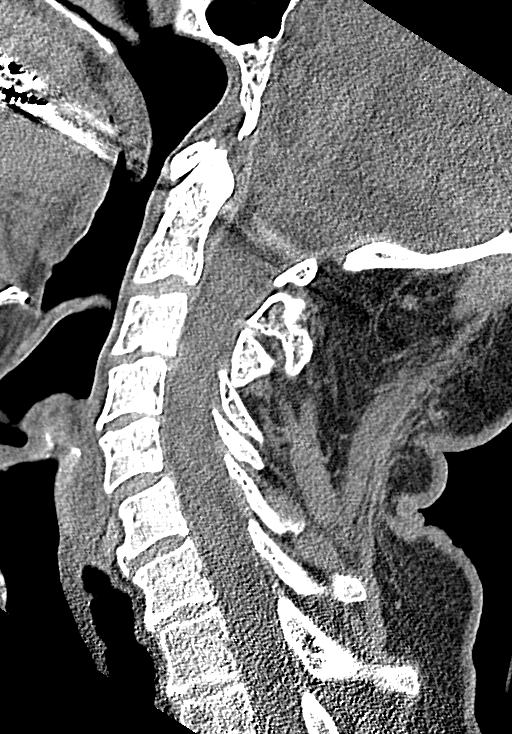
[im 35/69  bone]
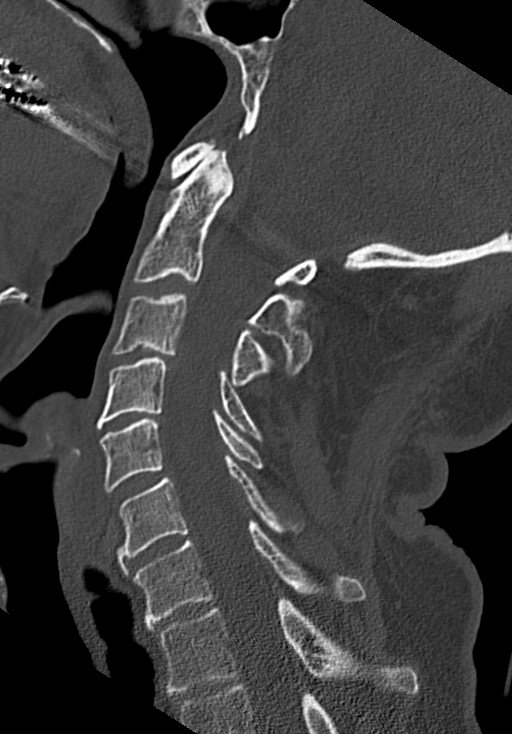
[im 40/69  bone]
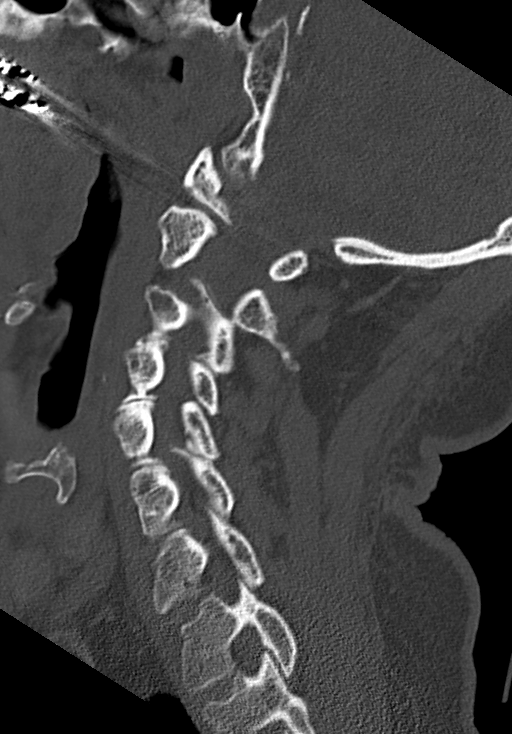
[im 46/69  bone]
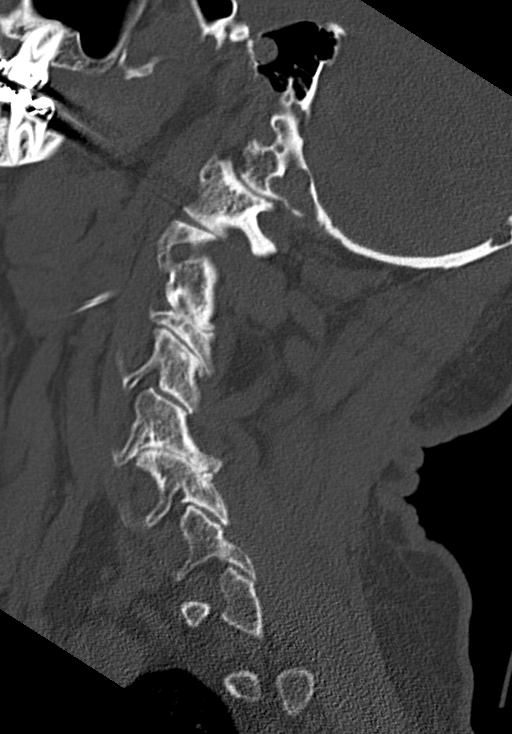

[Series 5: coronal bone · coronal · 0.25mm/px · 3 of 66 slices shown]
[im 17/66  bone]
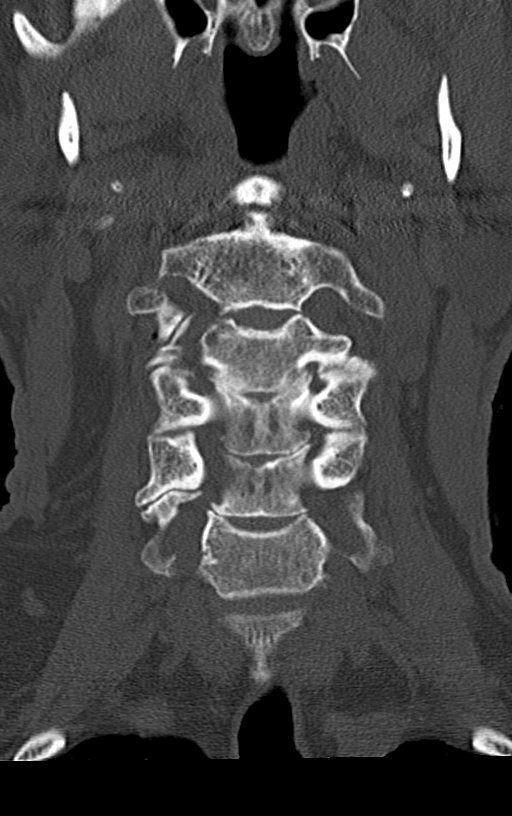
[im 28/66  bone]
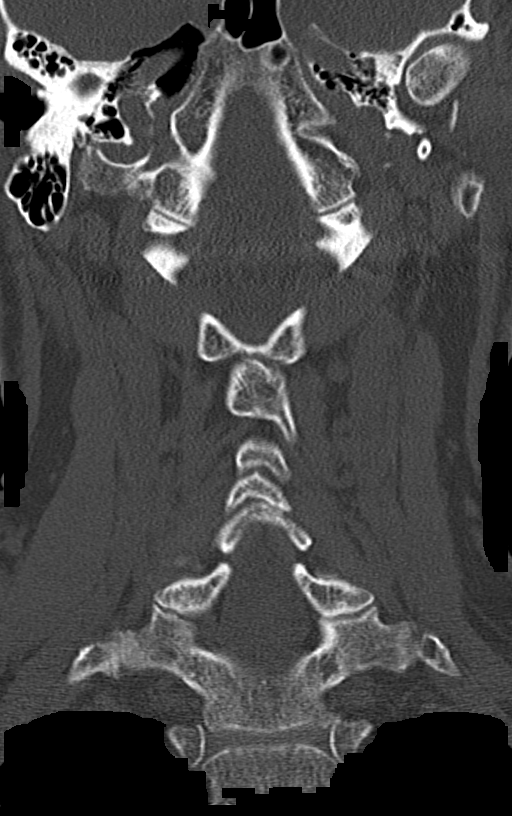
[im 38/66  bone]
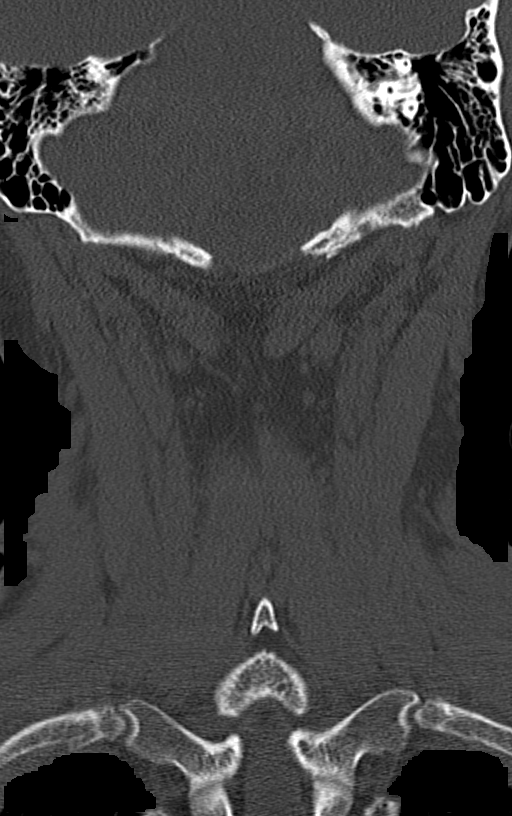

[Series 6: orthogonal axials · axial · 0.25mm/px · z∈[+147,+263]mm · 3 of 99 slices shown, 4 images]
[im 17/99  soft-tissue]
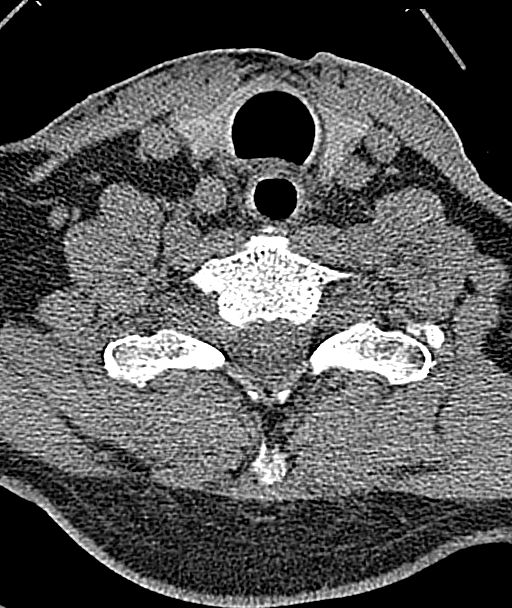
[im 17/99  bone]
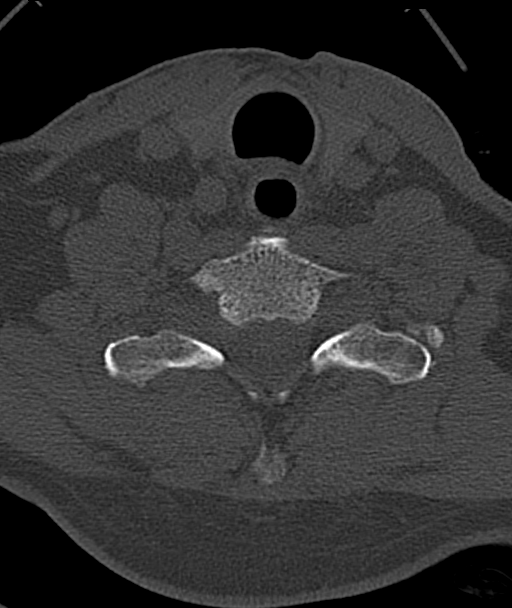
[im 50/99  bone]
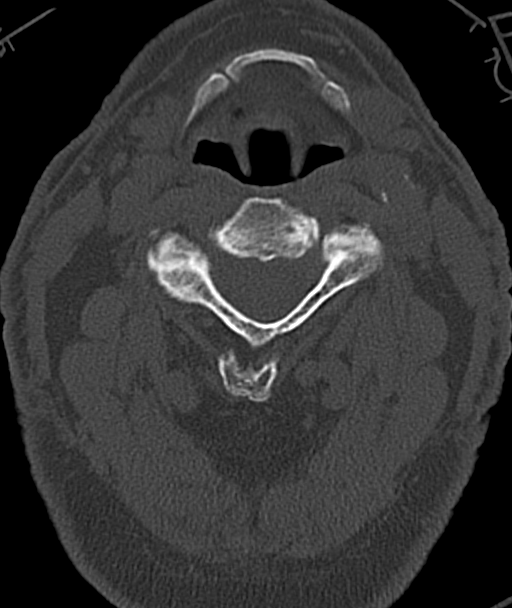
[im 82/99  bone]
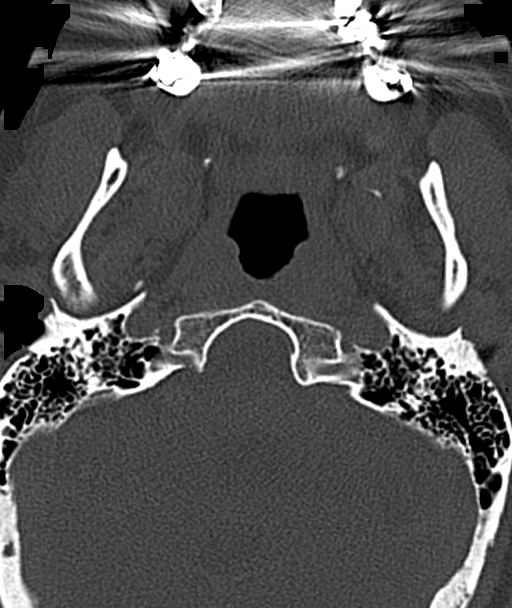

[11 of 33 positions shown; findings below may reference images not displayed]

FINDINGS: CT HEAD FINDINGS

Brain: No evidence of acute infarction, hemorrhage, hydrocephalus,
extra-axial collection or mass lesion/mass effect. Periventricular
white matter hypoattenuation likely represents chronic small vessel
ischemic disease.

Vascular: There are vascular calcifications in the carotid siphons.

Skull: Normal. Negative for fracture or focal lesion.

Other: None.

CT MAXILLOFACIAL FINDINGS

Osseous: A questionable cortical defect of the right anterior nasal
spine of the maxilla is seen on the axial views (series 3, images 50
3-54). Given the lack of significant soft tissue swelling or gas,
this is favored to reflect a chronic finding as opposed to acute
fracture. No mandibular dislocation. No destructive process.

Orbits: Negative. No traumatic or inflammatory finding.

Sinuses: There is mild right ethmoid and right maxillary sinus
disease.

Soft tissues: Negative.

CT CERVICAL SPINE FINDINGS

Alignment: Normal.

Skull base and vertebrae: No acute fracture. No primary bone lesion
or focal pathologic process.

Soft tissues and spinal canal: No prevertebral fluid or swelling. No
visible canal hematoma.

Disc levels: Up to moderate multilevel degenerative disc and joint
disease.

Upper chest: Negative.

Other: None.
IMPRESSION: 1. No acute intracranial process.
2. No acute osseous injury in the cervical spine.
3. Apparent cortical defect of the right anterior nasal spine of the
maxilla is favored to reflect a chronic finding as opposed to an
acute fracture. Correlation with point tenderness is recommended to
assess for the acuity of this finding.

## 2023-05-06 IMAGING — CT CT HEAD W/O CM
3 series · 15 of 47 positions shown, 18 images · non-contrast
Comparison: CT head and neck dated 09/12/2017.

CLINICAL DATA: Fall from standing with facial trauma and pain.

EXAM:
CT HEAD WITHOUT CONTRAST
CT MAXILLOFACIAL WITHOUT CONTRAST
CT CERVICAL SPINE WITHOUT CONTRAST
TECHNIQUE: Multidetector CT imaging of the head, cervical spine, and
maxillofacial structures were performed using the standard protocol
without intravenous contrast. Multiplanar CT image reconstructions
of the cervical spine and maxillofacial structures were also
generated.

[Series 2: head wo · axial · 0.44mm/px · z∈[+294,+434]mm · 9 of 34 slices shown, 12 images]
[im 3/34  brain]
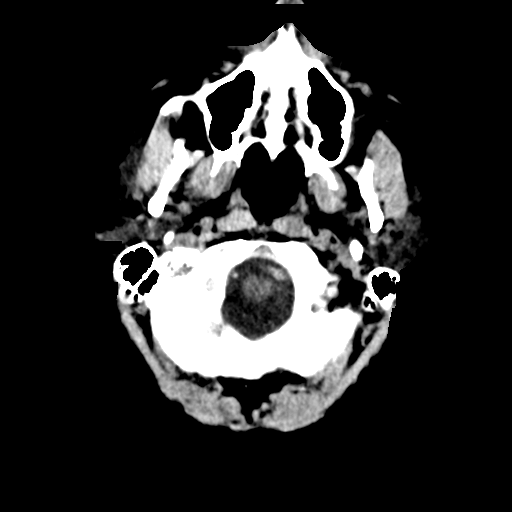
[im 3/34  bone]
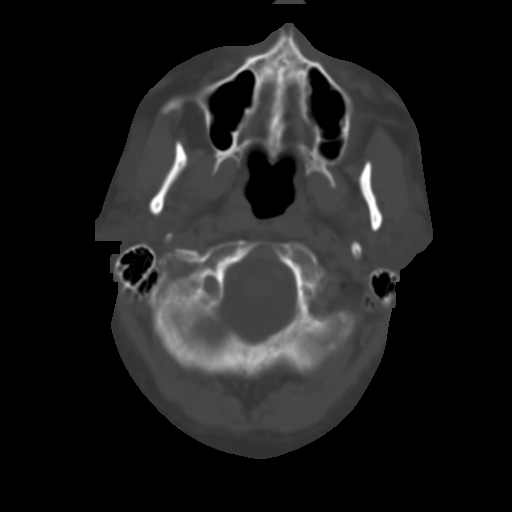
[im 6/34  brain]
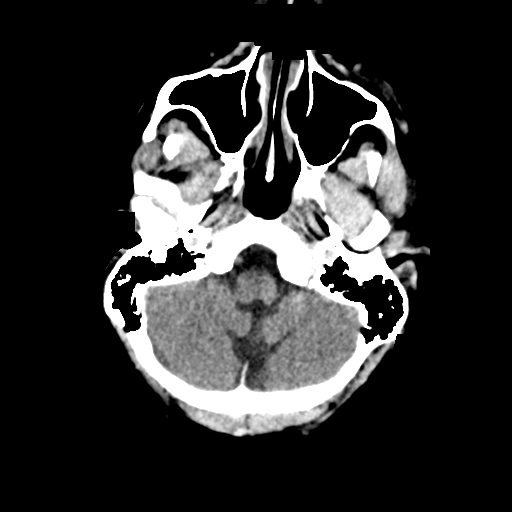
[im 10/34  brain]
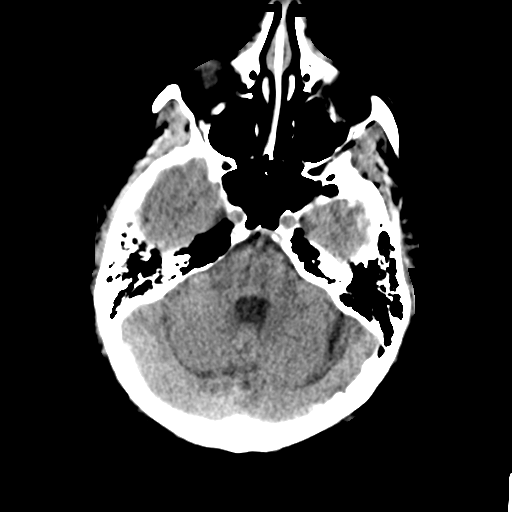
[im 13/34  brain]
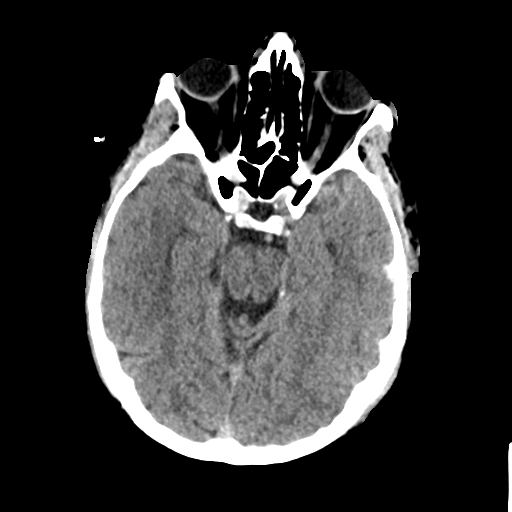
[im 18/34  brain]
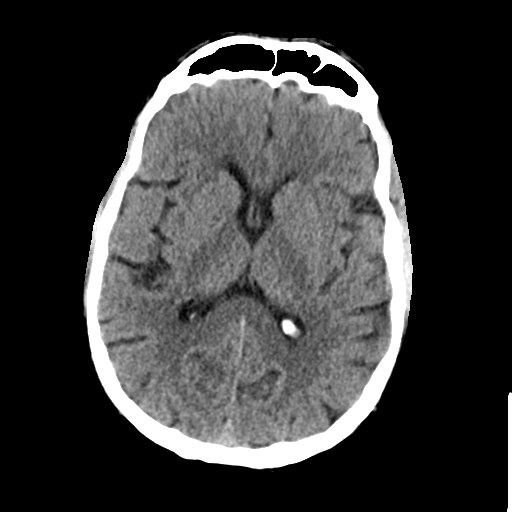
[im 18/34  bone]
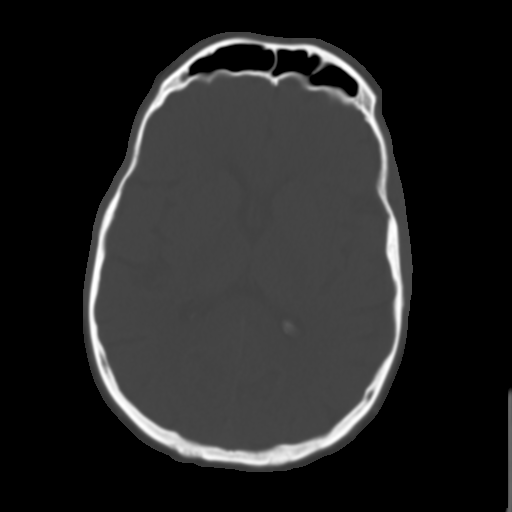
[im 21/34  brain]
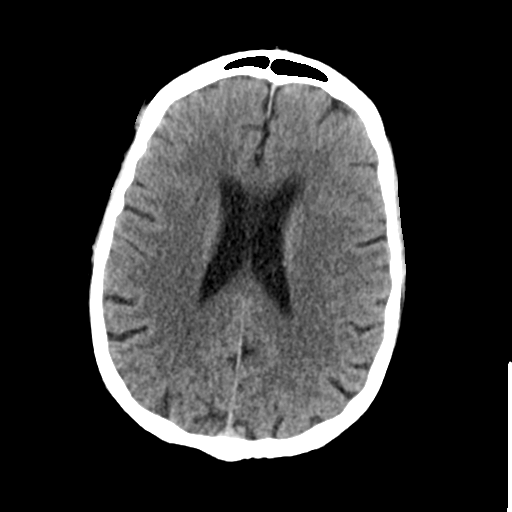
[im 24/34  brain]
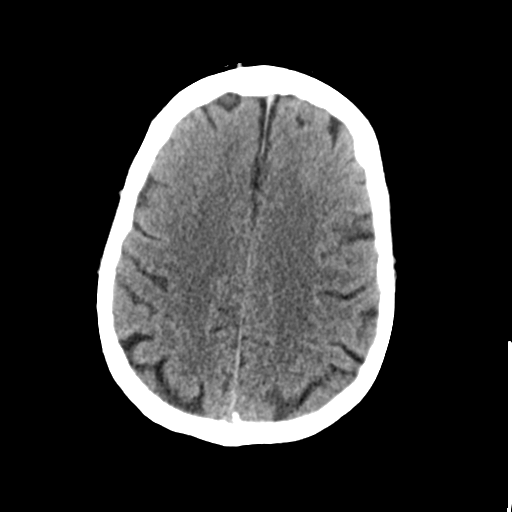
[im 28/34  brain]
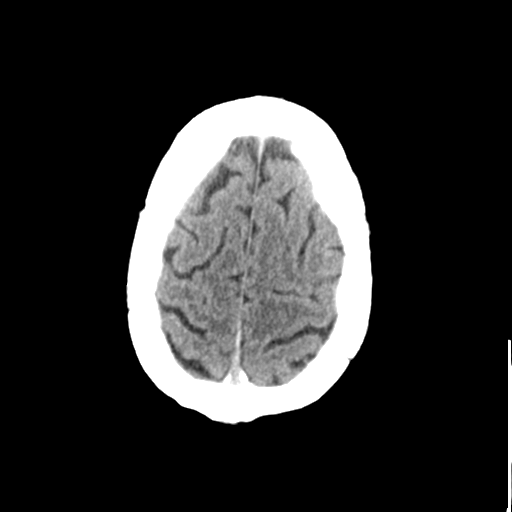
[im 31/34  brain]
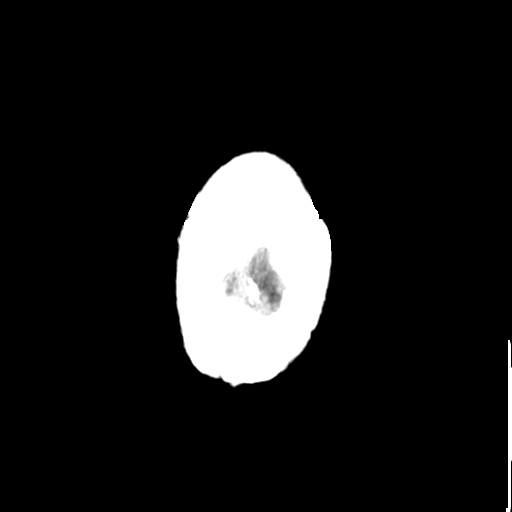
[im 31/34  bone]
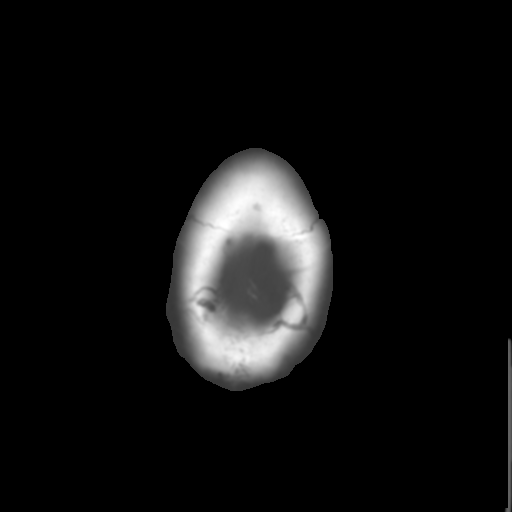

[Series 4: coronal soft tissue · coronal · 0.35mm/px · 3 of 70 slices shown]
[im 24/70  brain]
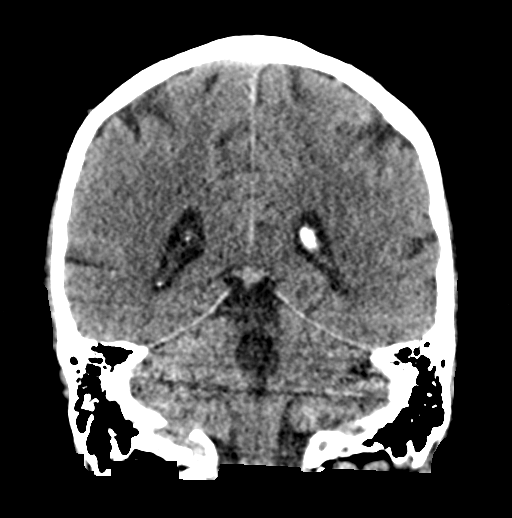
[im 31/70  brain]
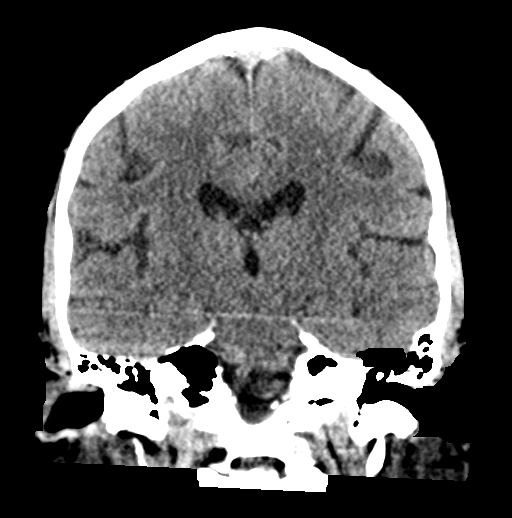
[im 39/70  brain]
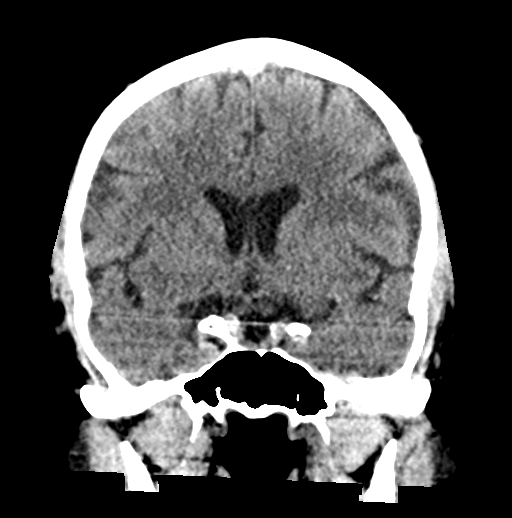

[Series 5: sagittal soft tissue · sagittal · 0.35mm/px · 3 of 57 slices shown]
[im 19/57  brain]
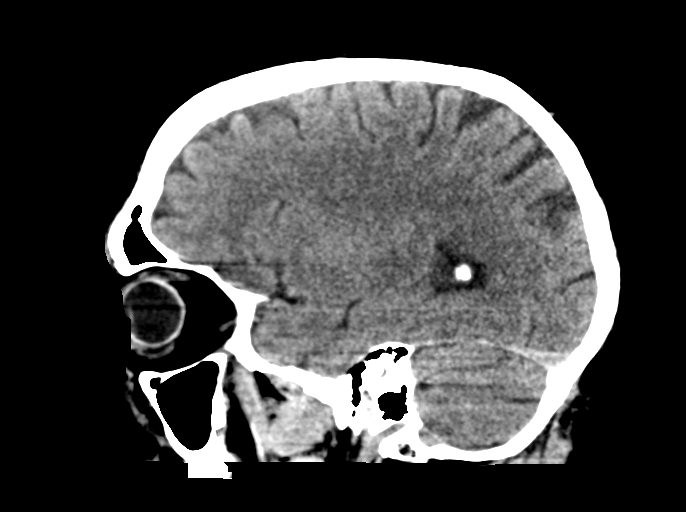
[im 29/57  brain]
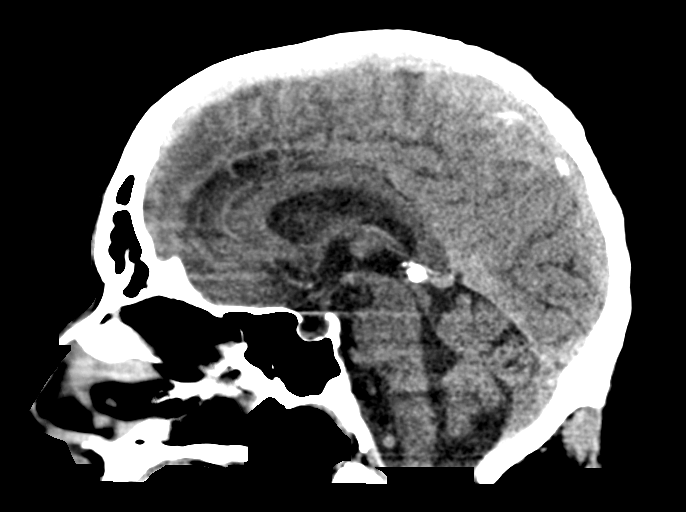
[im 38/57  brain]
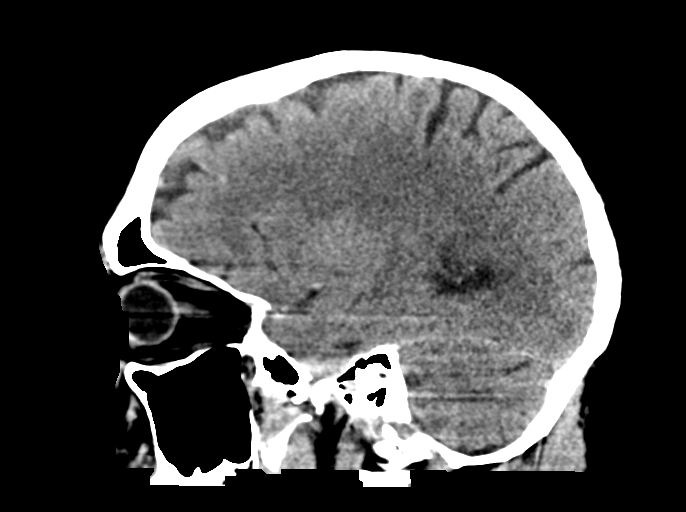

[15 of 47 positions shown; findings below may reference images not displayed]

FINDINGS: CT HEAD FINDINGS

Brain: No evidence of acute infarction, hemorrhage, hydrocephalus,
extra-axial collection or mass lesion/mass effect. Periventricular
white matter hypoattenuation likely represents chronic small vessel
ischemic disease.

Vascular: There are vascular calcifications in the carotid siphons.

Skull: Normal. Negative for fracture or focal lesion.

Other: None.

CT MAXILLOFACIAL FINDINGS

Osseous: A questionable cortical defect of the right anterior nasal
spine of the maxilla is seen on the axial views (series 3, images 50
3-54). Given the lack of significant soft tissue swelling or gas,
this is favored to reflect a chronic finding as opposed to acute
fracture. No mandibular dislocation. No destructive process.

Orbits: Negative. No traumatic or inflammatory finding.

Sinuses: There is mild right ethmoid and right maxillary sinus
disease.

Soft tissues: Negative.

CT CERVICAL SPINE FINDINGS

Alignment: Normal.

Skull base and vertebrae: No acute fracture. No primary bone lesion
or focal pathologic process.

Soft tissues and spinal canal: No prevertebral fluid or swelling. No
visible canal hematoma.

Disc levels: Up to moderate multilevel degenerative disc and joint
disease.

Upper chest: Negative.

Other: None.
IMPRESSION: 1. No acute intracranial process.
2. No acute osseous injury in the cervical spine.
3. Apparent cortical defect of the right anterior nasal spine of the
maxilla is favored to reflect a chronic finding as opposed to an
acute fracture. Correlation with point tenderness is recommended to
assess for the acuity of this finding.

## 2024-02-10 ENCOUNTER — Other Ambulatory Visit: Payer: Self-pay | Admitting: Medical Genetics

## 2024-02-13 ENCOUNTER — Other Ambulatory Visit
Admission: RE | Admit: 2024-02-13 | Discharge: 2024-02-13 | Disposition: A | Discharge status: Home / Self Care | Payer: Self-pay | Source: Ambulatory Visit | Attending: Medical Genetics | Admitting: Medical Genetics

## 2024-02-22 LAB — GENECONNECT MOLECULAR SCREEN: Genetic Analysis Overall Interpretation: NEGATIVE

## 2024-06-20 ENCOUNTER — Ambulatory Visit
